# Patient Record
Sex: Female | Born: 1965 | Hispanic: No | Marital: Married | Smoking: Former smoker
Health system: Southern US, Community
[De-identification: ages and names within clinical notes are randomized; demographics above are authoritative.]

## PROBLEM LIST (undated history)

## (undated) HISTORY — PX: TUBAL LIGATION: SHX77

---

## 2001-02-26 ENCOUNTER — Other Ambulatory Visit: Admission: RE | Admit: 2001-02-26 | Discharge: 2001-02-26 | Payer: Self-pay | Admitting: Obstetrics and Gynecology

## 2013-07-08 ENCOUNTER — Other Ambulatory Visit: Payer: Self-pay | Admitting: Family Medicine

## 2013-07-08 DIAGNOSIS — Z1231 Encounter for screening mammogram for malignant neoplasm of breast: Secondary | ICD-10-CM

## 2013-07-15 ENCOUNTER — Ambulatory Visit: Payer: Self-pay

## 2013-07-16 ENCOUNTER — Ambulatory Visit
Admission: RE | Admit: 2013-07-16 | Discharge: 2013-07-16 | Disposition: A | Discharge status: Home / Self Care | Payer: 59 | Source: Ambulatory Visit | Attending: Family Medicine | Admitting: Family Medicine

## 2013-07-16 DIAGNOSIS — Z1231 Encounter for screening mammogram for malignant neoplasm of breast: Secondary | ICD-10-CM

## 2018-12-20 ENCOUNTER — Other Ambulatory Visit: Payer: Self-pay

## 2018-12-20 ENCOUNTER — Encounter: Payer: Self-pay | Admitting: Family Medicine

## 2018-12-20 ENCOUNTER — Ambulatory Visit (INDEPENDENT_AMBULATORY_CARE_PROVIDER_SITE_OTHER): Payer: BC Managed Care – PPO | Admitting: Family Medicine

## 2018-12-20 ENCOUNTER — Encounter (INDEPENDENT_AMBULATORY_CARE_PROVIDER_SITE_OTHER): Payer: Self-pay

## 2018-12-20 VITALS — BP 122/98 | HR 60 | Temp 98.4°F | Resp 16 | Ht 63.25 in | Wt 258.0 lb

## 2018-12-20 DIAGNOSIS — Z6841 Body Mass Index (BMI) 40.0 and over, adult: Secondary | ICD-10-CM | POA: Diagnosis not present

## 2018-12-20 DIAGNOSIS — R030 Elevated blood-pressure reading, without diagnosis of hypertension: Secondary | ICD-10-CM | POA: Diagnosis not present

## 2018-12-20 DIAGNOSIS — Z1239 Encounter for other screening for malignant neoplasm of breast: Secondary | ICD-10-CM

## 2018-12-20 DIAGNOSIS — N951 Menopausal and female climacteric states: Secondary | ICD-10-CM

## 2018-12-20 DIAGNOSIS — Z1211 Encounter for screening for malignant neoplasm of colon: Secondary | ICD-10-CM

## 2018-12-20 LAB — CBC
HCT: 38.2 % (ref 36.0–46.0)
Hemoglobin: 13.1 g/dL (ref 12.0–15.0)
MCHC: 34.2 g/dL (ref 30.0–36.0)
MCV: 90 fl (ref 78.0–100.0)
Platelets: 244 10*3/uL (ref 150.0–400.0)
RBC: 4.25 Mil/uL (ref 3.87–5.11)
RDW: 12.5 % (ref 11.5–15.5)
WBC: 7.9 10*3/uL (ref 4.0–10.5)

## 2018-12-20 LAB — COMPREHENSIVE METABOLIC PANEL
ALT: 30 U/L (ref 0–35)
AST: 22 U/L (ref 0–37)
Albumin: 4 g/dL (ref 3.5–5.2)
Alkaline Phosphatase: 89 U/L (ref 39–117)
BUN: 16 mg/dL (ref 6–23)
CO2: 30 mEq/L (ref 19–32)
Calcium: 9.4 mg/dL (ref 8.4–10.5)
Chloride: 103 mEq/L (ref 96–112)
Creatinine, Ser: 0.79 mg/dL (ref 0.40–1.20)
GFR: 76.42 mL/min (ref >60.00)
Glucose, Bld: 79 mg/dL (ref 70–99)
Potassium: 4 mEq/L (ref 3.5–5.1)
Sodium: 139 mEq/L (ref 135–145)
Total Bilirubin: 0.8 mg/dL (ref 0.2–1.2)
Total Protein: 7.6 g/dL (ref 6.0–8.3)

## 2018-12-20 LAB — LIPID PANEL
Cholesterol: 207 mg/dL — ABNORMAL HIGH (ref 0–200)
HDL: 65.4 mg/dL (ref >39.00)
LDL Cholesterol: 127 mg/dL — ABNORMAL HIGH (ref 0–99)
NonHDL: 142.08
Total CHOL/HDL Ratio: 3
Triglycerides: 73 mg/dL (ref 0.0–149.0)
VLDL: 14.6 mg/dL (ref 0.0–40.0)

## 2018-12-20 LAB — TSH: TSH: 1.36 u[IU]/mL (ref 0.35–4.50)

## 2018-12-20 NOTE — Assessment & Plan Note (Signed)
Will get labs today. Pt also working on walking routine. She will be calorie counting and try to eat smaller meals throughout the day.

## 2018-12-20 NOTE — Assessment & Plan Note (Addendum)
Discussion on options. Has already been doing some lifestyle options. Worse at night. Will continue trial of Black Cohosh. If no improvement in 1-2 months will let me know and we can consider other prescription medication. Encouraged sleep aid as it seems to be helping. If significant side effects on OTC could do ambien in the future.

## 2018-12-20 NOTE — Assessment & Plan Note (Signed)
Reports normal BP at home with occasional monitoring. Will continue to monitor.

## 2018-12-20 NOTE — Progress Notes (Signed)
Subjective:     Renee Daniels is a [age] y.o. female presenting for Establish Care ([place] and Discuss menopause. (has had irregular period x 6 months, hot flashes, night sweats-since February 2020. )     HPI   #Menopausal Symptoms - irregular period x 6 months - hot flashes and night sweats since Feb 2020 - will get a cycle every 2 months and have been heavy - heaviest day changing her pad every 1-2 hours on day 2 and 3 - hot flashes have been "unbearable" and night sweats are impacting sleep - is waking up in the middle of the night with symptoms - every 45 minutes will start - using a fan by her bedside and portable fan in her face, and ceiling - is occasionally getting them during the day - no vaginal symptoms - has been walking 3.5 miles at 7 am - just started black Cohosh last week - taking sleep aid which does help but causes some terror dreams - melatonin does not work as well   #weight gain - feels like she gained 20 lbs in the last 4 months - typically eating 1 big meal a day - and not doing a lot of small meals - does not take time to eat  - Takes her BP at home and has no history of being high in the past  Review of Systems  Constitutional: Negative for chills and fever.  Respiratory: Negative for cough and shortness of breath.   Cardiovascular: Negative for chest pain.  Endocrine: Positive for heat intolerance. Negative for polydipsia and polyuria.  Genitourinary: Negative for vaginal pain.  Neurological: Negative for dizziness.     Social History   Tobacco Use  Smoking Status Former Smoker  . Packs/day: 0.25  . Years: 17.00  . Pack years: 4.25  . Types: Cigarettes  . Last attempt to quit: 2011  . Years since quitting: 9.4  Smokeless Tobacco Never Used        Objective:    BP Readings from Last 3 Encounters:  12/20/18 (!) 122/98   Wt Readings from Last 3 Encounters:  12/20/18 258 lb (117 kg)   Previous weight had been as low as 230 lbs  BP (!) 122/98   Pulse 60   Temp 98.4 F (36.9 C)   Resp 16   Ht 5' 3.25" (1.607 m)   Wt 258 lb (117 kg)   LMP 11/18/2018   SpO2 96%   BMI 45.34 kg/m    Physical Exam Constitutional:      General: She is not in acute distress.    Appearance: She is well-developed. She is not diaphoretic.  HENT:     Right Ear: External ear normal.     Left Ear: External ear normal.     Nose: Nose normal.  Eyes:     Conjunctiva/sclera: Conjunctivae normal.  Neck:     Musculoskeletal: Neck supple.  Cardiovascular:     Rate and Rhythm: Normal rate and regular rhythm.     Heart sounds: No murmur.  Pulmonary:     Effort: Pulmonary effort is normal. No respiratory distress.     Breath sounds: Normal breath sounds. No wheezing.  Skin:    General: Skin is warm and dry.     Capillary Refill: Capillary refill takes less than 2 seconds.  Neurological:     Mental Status: She is alert. Mental status is at baseline.  Psychiatric:  Mood and Affect: Mood normal.        Behavior: Behavior normal.           Assessment & Plan:   Problem List Items Addressed This Visit      Other   Perimenopause - Primary    Discussion on options. Has already been doing some lifestyle options. Worse at night. Will continue trial of Black Cohosh. If no improvement in 1-2 months will let me know and we can consider other prescription medication. Encouraged sleep aid as it seems to be helping. If significant side effects on OTC could do ambien in the future.      Relevant Orders   CBC   Class 3 severe obesity due to excess calories without serious comorbidity with body mass index (BMI) of 45.0 to 49.9 in adult [facility])    Will get labs today. Pt also working on walking routine. She will be calorie counting and try to eat smaller meals throughout the day.       Relevant Orders   Lipid panel   Comprehensive metabolic panel   TSH   Elevated blood pressure reading     Reports normal BP at home with occasional monitoring. Will continue to monitor.        Other Visit Diagnoses    Screening for colon cancer       Relevant Orders   Fecal occult blood, imunochemical   Screening for breast cancer       Relevant Orders   MM Digital Screening       Return in about 4 weeks (around 01/17/2019) for if menopausal symptoms not improving.  Lynnda Child, MD

## 2018-12-20 NOTE — Patient Instructions (Signed)
Menopause Menopause may increase your risk for:  Loss of bone (osteoporosis), which causes bone breaks (fractures).  Depression.  Hardening and narrowing of the arteries (atherosclerosis), which can cause heart attacks and strokes. What are the causes? This condition is usually caused by a natural change in hormone levels that happens as you get older. The condition may also be caused by surgery to remove both ovaries (bilateral oophorectomy). Follow these instructions at home: Lifestyle  Do not use any products that contain nicotine or tobacco, such as cigarettes and e-cigarettes. If you need help quitting, ask your health care provider.  Get at least 30 minutes of physical activity on 5 or more days each week.  Avoid alcoholic and caffeinated beverages, as well as spicy foods. This may help prevent hot flashes.  Get 7-8 hours of sleep each night.  If you have hot flashes, try: ? Dressing in layers. ? Avoiding things that may trigger hot flashes, such as spicy food, hot drinks, alcohol, caffeine, warm places, or stress. ? Taking slow, deep breaths when a hot flash starts. ? Keeping a fan in your home and office.  Find ways to manage stress: regular exercise, meditation, yoga, qigong, Tai Chi, biofeedback, acupuncture or massage  Consider going to group therapy with other women who are having menopause symptoms. Ask your health care provider about recommended group therapy meetings.  Staying cool while sleeping: dress in light clothing, use layed bedding that can be easily removed, Sleep with a fan nearby, put an ice pack under your pillow and flip your pillow regularly  Eating and drinking  Eat a healthy, balanced diet that contains whole grains, lean protein, low-fat dairy, and plenty of fruits and vegetables.  Your health care provider may recommend adding more soy to your diet. Foods that contain soy include tofu, tempeh, and soy milk.  Eat plenty of foods that contain  calcium and vitamin D for bone health. Items that are rich in calcium include low-fat milk, yogurt, beans, almonds, sardines, broccoli, and kale. Medicines  Non-prescription medications for Hot Flashes ? Soy - eat 1-2 servings of soy foods daily ? Herbs: like black cohosh have shown some improvement with hot flashes  Talk with your health care provider before starting any herbal supplements. If prescribed, take vitamins and supplements as told by your health care provider. These may include: ? Calcium. Women age 51 and older should get 1,200 mg (milligrams) of calcium every day. ? Vitamin D. Women need 600-800 International Units of vitamin D each day.  Prescription Medications ? Hormone Treatment: increased risk of breast cancer and cardiovascular disease. Should be used for a short time and in women under 60 have a lower risk overall if they take it ? Non-Hormone Treatment: Paroxetine which is an antidepressant, has been shown to reduce Hot Flashes   

## 2018-12-31 ENCOUNTER — Other Ambulatory Visit (INDEPENDENT_AMBULATORY_CARE_PROVIDER_SITE_OTHER): Payer: BC Managed Care – PPO

## 2018-12-31 DIAGNOSIS — Z1211 Encounter for screening for malignant neoplasm of colon: Secondary | ICD-10-CM

## 2018-12-31 LAB — FECAL OCCULT BLOOD, IMMUNOCHEMICAL: Fecal Occult Bld: NEGATIVE

## 2019-01-07 ENCOUNTER — Telehealth: Payer: Self-pay

## 2019-01-07 NOTE — Telephone Encounter (Signed)
Left message for patient to call back  

## 2019-01-10 ENCOUNTER — Other Ambulatory Visit: Payer: Self-pay

## 2019-01-10 ENCOUNTER — Ambulatory Visit
Admission: RE | Admit: 2019-01-10 | Discharge: 2019-01-10 | Disposition: A | Discharge status: Home / Self Care | Payer: BC Managed Care – PPO | Source: Ambulatory Visit | Attending: Family Medicine | Admitting: Family Medicine

## 2019-01-10 DIAGNOSIS — Z1239 Encounter for other screening for malignant neoplasm of breast: Secondary | ICD-10-CM

## 2019-01-11 ENCOUNTER — Telehealth: Payer: Self-pay

## 2019-01-11 NOTE — Telephone Encounter (Signed)
Left message for patient to call back  

## 2019-01-14 NOTE — Telephone Encounter (Signed)
[name] - Client Nonclinical Telephone Record AccessNurse Client Renee Daniels - Client Client Site [place] Physician Waunita Schooner- MD Contact Type Call Who Is Calling Patient / Member / Family / Caregiver Caller Name Renee Daniels Caller Phone Number [phone] Call Type Message Only Information Provided Reason for Call Returning a Call from the Office Initial [place] states that she was just returning a call from [place]. Additional Comment Call Closed By: Carma Lair Transaction Date/Time: 01/11/2019 5:09:20 PM (ET)

## 2019-01-14 NOTE — Telephone Encounter (Signed)
Patient advised of mammogram results.  °

## 2020-02-25 ENCOUNTER — Other Ambulatory Visit: Payer: Self-pay | Admitting: Family Medicine

## 2020-02-25 DIAGNOSIS — Z1231 Encounter for screening mammogram for malignant neoplasm of breast: Secondary | ICD-10-CM

## 2020-02-28 ENCOUNTER — Ambulatory Visit
Admission: RE | Admit: 2020-02-28 | Discharge: 2020-02-28 | Disposition: A | Discharge status: Home / Self Care | Payer: BC Managed Care – PPO | Source: Ambulatory Visit | Attending: Family Medicine | Admitting: Family Medicine

## 2020-02-28 ENCOUNTER — Other Ambulatory Visit: Payer: Self-pay

## 2020-02-28 DIAGNOSIS — Z1231 Encounter for screening mammogram for malignant neoplasm of breast: Secondary | ICD-10-CM

## 2020-04-23 ENCOUNTER — Other Ambulatory Visit (HOSPITAL_COMMUNITY)
Admission: RE | Admit: 2020-04-23 | Discharge: 2020-04-23 | Disposition: A | Discharge status: Home / Self Care | Payer: BC Managed Care – PPO | Source: Ambulatory Visit | Attending: Family Medicine | Admitting: Family Medicine

## 2020-04-23 ENCOUNTER — Other Ambulatory Visit: Payer: Self-pay

## 2020-04-23 ENCOUNTER — Ambulatory Visit (INDEPENDENT_AMBULATORY_CARE_PROVIDER_SITE_OTHER): Payer: BC Managed Care – PPO | Admitting: Family Medicine

## 2020-04-23 VITALS — BP 126/90 | HR 59 | Temp 95.2°F | Ht 63.0 in | Wt 266.0 lb

## 2020-04-23 DIAGNOSIS — Z0000 Encounter for general adult medical examination without abnormal findings: Secondary | ICD-10-CM | POA: Diagnosis not present

## 2020-04-23 DIAGNOSIS — Z6841 Body Mass Index (BMI) 40.0 and over, adult: Secondary | ICD-10-CM

## 2020-04-23 DIAGNOSIS — Z124 Encounter for screening for malignant neoplasm of cervix: Secondary | ICD-10-CM | POA: Insufficient documentation

## 2020-04-23 DIAGNOSIS — I10 Essential (primary) hypertension: Secondary | ICD-10-CM | POA: Diagnosis not present

## 2020-04-23 DIAGNOSIS — Z1211 Encounter for screening for malignant neoplasm of colon: Secondary | ICD-10-CM

## 2020-04-23 DIAGNOSIS — Z23 Encounter for immunization: Secondary | ICD-10-CM | POA: Diagnosis not present

## 2020-04-23 LAB — LIPID PANEL
Cholesterol: 217 mg/dL — ABNORMAL HIGH (ref 0–200)
HDL: 58.2 mg/dL (ref >39.00)
LDL Cholesterol: 130 mg/dL — ABNORMAL HIGH (ref 0–99)
NonHDL: 158.56
Total CHOL/HDL Ratio: 4
Triglycerides: 144 mg/dL (ref 0.0–149.0)
VLDL: 28.8 mg/dL (ref 0.0–40.0)

## 2020-04-23 LAB — COMPREHENSIVE METABOLIC PANEL
ALT: 49 U/L — ABNORMAL HIGH (ref 0–35)
AST: 54 U/L — ABNORMAL HIGH (ref 0–37)
Albumin: 4.1 g/dL (ref 3.5–5.2)
Alkaline Phosphatase: 130 U/L — ABNORMAL HIGH (ref 39–117)
BUN: 24 mg/dL — ABNORMAL HIGH (ref 6–23)
CO2: 30 mEq/L (ref 19–32)
Calcium: 9.7 mg/dL (ref 8.4–10.5)
Chloride: 103 mEq/L (ref 96–112)
Creatinine, Ser: 0.9 mg/dL (ref 0.40–1.20)
GFR: 72.82 mL/min (ref >60.00)
Glucose, Bld: 85 mg/dL (ref 70–99)
Potassium: 4.4 mEq/L (ref 3.5–5.1)
Sodium: 140 mEq/L (ref 135–145)
Total Bilirubin: 0.5 mg/dL (ref 0.2–1.2)
Total Protein: 7.5 g/dL (ref 6.0–8.3)

## 2020-04-23 LAB — HEMOGLOBIN A1C: Hgb A1c MFr Bld: 4.6 % (ref 4.6–6.5)

## 2020-04-23 NOTE — Progress Notes (Signed)
Annual Exam   Chief Complaint:  Chief Complaint  Patient presents with  . Annual Exam    w/ pap     History of Present Illness:  Ms. Renee Daniels is a 54 y.o. No obstetric history on file. who LMP was No LMP recorded. Patient is perimenopausal., presents today for her annual examination.     Nutrition She does get adequate calcium and Vitamin D in her diet. Diet: intermittent fasting - 2 weeks, light meals, more protein and decreased carbs Exercise: walking more, but not as consistent as she should  Safety The patient wears seatbelts: yes.     The patient feels safe at home and in their relationships: yes.  Menstrual:  Symptoms of menopause: hot flashes - using a fan    GYN She is single partner, contraception - post menopausal status.    Cervical Cancer Screening (21-65):   Last Pap:  2018 Results were: no abnormalities /neg HPV DNA - unknown  Breast Cancer Screening (Age 50-74):  There is no FH of breast cancer. There is no FH of ovarian cancer. BRCA screening Not Indicated.  Last Mammogram: 02/2020 The patient does want a mammogram this year.    Colon Cancer Screening:  Age 23-75 yo - benefits outweigh the risk. Adults 31-85 yo who have never been screened benefit.  Benefits: 134000 people in 2016 will be diagnosed and 49,000 will die - early detection helps [place]: Complications 2/2 to colonoscopy High Risk (Colonoscopy): genetic disorder (Lynch syndrome or familial adenomatous polyposis), personal hx of IBD, previous adenomatous polyp, or previous colorectal cancer, FamHx start 10 years before the age at diagnosis, increased in males and black race  Options:  FIT - looks for hemoglobin (blood in the stool) - specific and fairly sensitive - must be done annually Cologuard - looks for DNA and blood - more sensitive - therefore can have more false positives, every 3 years Colonoscopy - every 10 years if normal - sedation, bowl prep, must have someone drive  you  Shared decision making and the patient had decided to do colonoscopy.   Social History   Tobacco Use  Smoking Status Former Smoker  . Packs/day: 0.25  . Years: 17.00  . Pack years: 4.25  . Types: Cigarettes  . Quit date: 2011  . Years since quitting: 10.7  Smokeless Tobacco Never Used    Lung Cancer Screening (Ages 32-80): no   Weight Wt Readings from Last 3 Encounters:  04/23/20 266 lb (120.7 kg)  12/20/18 258 lb (117 kg)   Patient has very high BMI  BMI Readings from Last 1 Encounters:  04/23/20 47.12 kg/m     Chronic disease screening Blood pressure monitoring:  BP Readings from Last 3 Encounters:  04/23/20 126/90  12/20/18 (!) 122/98    Lipid Monitoring: Indication for screening: age >47, obesity, diabetes, family hx, CV risk factors.  Lipid screening: Yes  Lab Results  Component Value Date   CHOL 207 (H) 12/20/2018   HDL 65.40 12/20/2018   LDLCALC 127 (H) 12/20/2018   TRIG 73.0 12/20/2018   CHOLHDL 3 12/20/2018     Diabetes Screening: age >44, overweight, family hx, PCOS, hx of gestational diabetes, at risk ethnicity Diabetes Screening screening: Yes  No results found for: HGBA1C   No past medical history on file.  Past Surgical History:  Procedure Laterality Date  . CESAREAN SECTION     x 3  . TUBAL LIGATION      Prior to Admission medications  Medication Sig Start Date End Date Taking? Authorizing Provider  Multiple Vitamin (MULTIVITAMIN) tablet Take 1 tablet by mouth daily.   Yes [provider]  Nutritional Supplements (VITAMIN D BOOSTER PO) Take by mouth. 5000 units daily   Yes [provider]    No Known Allergies  Gynecologic History: No LMP recorded. Patient is perimenopausal.  Obstetric History: No obstetric history on file.  Social History   Socioeconomic History  . Marital status: Married    Spouse name: Hilliard Clark  . Number of children: 3  . Years of education: College  . Highest education level:  Not on file  Occupational History  . Not on file  Tobacco Use  . Smoking status: Former Smoker    Packs/day: 0.25    Years: 17.00    Pack years: 4.25    Types: Cigarettes    Quit date: 2011    Years since quitting: 10.7  . Smokeless tobacco: Never Used  Vaping Use  . Vaping Use: Never used  Substance and Sexual Activity  . Alcohol use: Yes    Comment: twice a week, 1 mixed drink  . Drug use: Never  . Sexual activity: Yes    Birth control/protection: Surgical  Other Topics Concern  . 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 Difficulty of Paying Living Expenses: Not on file  Food Insecurity:   . Worried About Charity fundraiser in the Last Year: Not on file  . Ran Out of Food in the Last Year: Not on file  Transportation Needs:   . Lack of Transportation (Medical): Not on file  . Lack of Transportation (Non-Medical): Not on file  Physical Activity:   . Days of Exercise per Week: Not on file  . Minutes of Exercise per Session: Not on file  Stress:   . Feeling of Stress : Not on file  Social Connections:   . Frequency of Communication with Friends and Family: Not on file  . Frequency of Social Gatherings with Friends and Family: Not on file  . Attends Religious Services: Not on file  . Active Member of Clubs or Organizations: Not on file  . Attends Archivist Meetings: Not on file  . Marital Status: Not on file  Intimate Partner Violence:   . Fear of Current or  Ex-Partner: Not on file  . Emotionally Abused: Not on file  . Physically Abused: Not on file  . Sexually Abused: Not on file    Family History  Problem Relation Age of Onset  . Cancer Mother        leukemia or lymphoma  . Hypertension Father   . Healthy Brother   . Healthy Daughter   . Healthy Son   . Pancreatic cancer Maternal Grandmother   . Heart disease Maternal Grandfather   .  Heart disease Paternal Grandmother   . Healthy Daughter   . Lung cancer Maternal Aunt     Review of Systems  Constitutional: Negative for chills and fever.  HENT: Negative for congestion and sore throat.   Eyes: Negative for blurred vision and double vision.  Respiratory: Negative for shortness of breath.   Cardiovascular: Negative for chest pain.  Gastrointestinal: Negative for heartburn, nausea and vomiting.  Genitourinary: Negative.   Musculoskeletal: Negative.  Negative for myalgias.  Skin: Negative for rash.  Neurological: Negative for dizziness and headaches.  Endo/Heme/Allergies: Does not bruise/bleed easily.  Psychiatric/Behavioral: Negative for depression. The patient is not nervous/anxious.      Physical Exam BP 126/90   Pulse (!) 59   Temp (!) 95.2 F (35.1 C) (Temporal)   Ht '5\' 3"'  (1.6 m)   Wt 266 lb (120.7 kg)   SpO2 97%   BMI 47.12 kg/m    BP Readings from Last 3 Encounters:  04/23/20 126/90  12/20/18 (!) 122/98      Physical Exam Exam conducted with a chaperone present.  Constitutional:      General: She is not in acute distress.    Appearance: She is well-developed. She is not diaphoretic.  HENT:     Head: Normocephalic and atraumatic.     Right Ear: External ear normal.     Left Ear: External ear normal.     Nose: Nose normal.  Eyes:     General: No scleral icterus.    Conjunctiva/sclera: Conjunctivae normal.  Cardiovascular:     Rate and Rhythm: Normal rate and regular rhythm.     Heart sounds: No murmur heard.   Pulmonary:     Effort: Pulmonary  effort is normal. No respiratory distress.     Breath sounds: Normal breath sounds. No wheezing.  Abdominal:     General: Bowel sounds are normal. There is no distension.     Palpations: Abdomen is soft. There is no mass.     Tenderness: There is no abdominal tenderness. There is no guarding or rebound.  Genitourinary:    Vagina: Normal.     Cervix: Normal.  Musculoskeletal:        General: Normal range of motion.     Cervical back: Neck supple.  Lymphadenopathy:     Cervical: No cervical adenopathy.  Skin:    General: Skin is warm and dry.     Capillary Refill: Capillary refill takes less than 2 seconds.  Neurological:     Mental Status: She is alert and oriented to person, place, and time.     Deep Tendon Reflexes: Reflexes normal.  Psychiatric:        Behavior: Behavior normal.     Results:  PHQ-9:    Office Visit from 12/20/2018 in [place] at [facility]  PHQ-9 Total Score 3        Assessment: 54 y.o. No obstetric history on file. female here for routine annual physical examination.  Plan: Problem List Items Addressed This Visit      Other   Class 3 severe obesity due to excess calories without serious comorbidity with body mass index (BMI) of 45.0 to 49.9 in adult [facility])   Relevant Orders   Lipid panel   Hemoglobin A1c    Other Visit Diagnoses    Annual physical exam    -  Primary   Need for influenza vaccination       Relevant Orders   Flu Vaccine QUAD 36+ mos IM (Completed)  Screening for colon cancer       Relevant Orders   Ambulatory referral to Gastroenterology   Essential hypertension       Relevant Orders   Comprehensive metabolic panel   Cervical cancer screening       Relevant Orders   Cytology - PAP      Screening: -- Blood pressure screen elevated: continued to monitor. -- cholesterol screening: will obtain -- Weight screening: overweight: continue to monitor -- Diabetes Screening: will obtain -- Nutrition: Encouraged  healthy diet  The 10-year ASCVD risk score Mikey Bussing DC Jr., et al., 2013) is: 1.4%   Values used to calculate the score:     Age: 31 years     Sex: Female     Is Non-Hispanic African American: No     Diabetic: No     Tobacco smoker: No     Systolic Blood Pressure: 578 mmHg     Is BP treated: No     HDL Cholesterol: 65.4 mg/dL     Total Cholesterol: 207 mg/dL  -- Statin therapy for Age 50-75 with CVD risk >7.5%  Psych -- Depression screening (PHQ-9):    Office Visit from 12/20/2018 in [place] at [facility]  PHQ-9 Total Score 3       Safety -- tobacco screening: not using -- alcohol screening:  low-risk usage. -- no evidence of domestic violence or intimate partner violence.   Cancer Screening -- pap smear collected per ASCCP guidelines -- family history of breast cancer screening: done. not at high risk. -- Mammogram - up to date -- Colon cancer (age 63+)-- referral  Immunizations Immunization History  Administered Date(s) Administered  . Hepatitis A 01/02/2016, 09/25/2016  . Hepatitis B 10/15/2016, 12/17/2016, 09/07/2017  . Influenza,inj,Quad PF,6+ Mos 04/23/2020  . PFIZER SARS-COV-2 Vaccination 10/12/2019, 11/05/2019  . Tdap 05/15/2017    -- flu vaccine up to date -- TDAP q10 years up to date -- Covid-19 Vaccine up to date   Encouraged healthy diet and exercise. Encouraged regular vision and dental care.    Lesleigh Noe, MD

## 2020-04-23 NOTE — Patient Instructions (Addendum)
Hot flashes - Try Vitamin E supplement for 2-4 weeks - If not working we can try Lexapro   Preventive Care 65-53 Years Old, Female Preventive care refers to visits with your health care provider and lifestyle choices that can promote health and wellness. This includes:  A yearly physical exam. This may also be called an annual well check.  Regular dental visits and eye exams.  Immunizations.  Screening for certain conditions.  Healthy lifestyle choices, such as eating a healthy diet, getting regular exercise, not using drugs or products that contain nicotine and tobacco, and limiting alcohol use. What can I expect for my preventive care visit? Physical exam Your health care provider will check your:  Height and weight. This may be used to calculate body mass index (BMI), which tells if you are at a healthy weight.  Heart rate and blood pressure.  Skin for abnormal spots. Counseling Your health care provider may ask you questions about your:  Alcohol, tobacco, and drug use.  Emotional well-being.  Home and relationship well-being.  Sexual activity.  Eating habits.  Work and work Statistician.  Method of birth control.  Menstrual cycle.  Pregnancy history. What immunizations do I need?  Influenza (flu) vaccine  This is recommended every year. Tetanus, diphtheria, and pertussis (Tdap) vaccine  You may need a Td booster every 10 years. Varicella (chickenpox) vaccine  You may need this if you have not been vaccinated. Zoster (shingles) vaccine  You may need this after age 9. Measles, mumps, and rubella (MMR) vaccine  You may need at least one dose of MMR if you were born in 1957 or later. You may also need a second dose. Pneumococcal conjugate (PCV13) vaccine  You may need this if you have certain conditions and were not previously vaccinated. Pneumococcal polysaccharide (PPSV23) vaccine  You may need one or two doses if you smoke cigarettes or if you  have certain conditions. Meningococcal conjugate (MenACWY) vaccine  You may need this if you have certain conditions. Hepatitis A vaccine  You may need this if you have certain conditions or if you travel or work in places where you may be exposed to hepatitis A. Hepatitis B vaccine  You may need this if you have certain conditions or if you travel or work in places where you may be exposed to hepatitis B. Haemophilus influenzae type b (Hib) vaccine  You may need this if you have certain conditions. Human papillomavirus (HPV) vaccine  If recommended by your health care provider, you may need three doses over 6 months. You may receive vaccines as individual doses or as more than one vaccine together in one shot (combination vaccines). Talk with your health care provider about the risks and benefits of combination vaccines. What tests do I need? Blood tests  Lipid and cholesterol levels. These may be checked every 5 years, or more frequently if you are over 38 years old.  Hepatitis C test.  Hepatitis B test. Screening  Lung cancer screening. You may have this screening every year starting at age 55 if you have a 30-pack-year history of smoking and currently smoke or have quit within the past 15 years.  Colorectal cancer screening. All adults should have this screening starting at age 20 and continuing until age 37. Your health care provider may recommend screening at age 22 if you are at increased risk. You will have tests every 1-10 years, depending on your results and the type of screening test.  Diabetes screening. This is  done by checking your blood sugar (glucose) after you have not eaten for a while (fasting). You may have this done every 1-3 years.  Mammogram. This may be done every 1-2 years. Talk with your health care provider about when you should start having regular mammograms. This may depend on whether you have a family history of breast cancer.  BRCA-related cancer  screening. This may be done if you have a family history of breast, ovarian, tubal, or peritoneal cancers.  Pelvic exam and Pap test. This may be done every 3 years starting at age 2. Starting at age 82, this may be done every 5 years if you have a Pap test in combination with an HPV test. Other tests  Sexually transmitted disease (STD) testing.  Bone density scan. This is done to screen for osteoporosis. You may have this scan if you are at high risk for osteoporosis. Follow these instructions at home: Eating and drinking  Eat a diet that includes fresh fruits and vegetables, whole grains, lean protein, and low-fat dairy.  Take vitamin and mineral supplements as recommended by your health care provider.  Do not drink alcohol if: ? Your health care provider tells you not to drink. ? You are pregnant, may be pregnant, or are planning to become pregnant.  If you drink alcohol: ? Limit how much you have to 0-1 drink a day. ? Be aware of how much alcohol is in your drink. In the [place] one drink equals one 12 oz bottle of beer (355 mL), one 5 oz glass of wine (148 mL), or one 1 oz glass of hard liquor (44 mL). Lifestyle  Take daily care of your teeth and gums.  Stay active. Exercise for at least 30 minutes on 5 or more days each week.  Do not use any products that contain nicotine or tobacco, such as cigarettes, e-cigarettes, and chewing tobacco. If you need help quitting, ask your health care provider.  If you are sexually active, practice safe sex. Use a condom or other form of birth control (contraception) in order to prevent pregnancy and STIs (sexually transmitted infections).  If told by your health care provider, take low-dose aspirin daily starting at age 34. What's next?  Visit your health care provider once a year for a well check visit.  Ask your health care provider how often you should have your eyes and teeth checked.  Stay up to date on all vaccines. This  information is not intended to replace advice given to you by your health care provider. Make sure you discuss any questions you have with your health care provider. Document Revised: 03/15/2018 Document Reviewed: 03/15/2018 Elsevier Patient Education  2020 [employer].

## 2020-04-30 LAB — CYTOLOGY - PAP
Comment: NEGATIVE
Diagnosis: UNDETERMINED — AB
High risk HPV: NEGATIVE

## 2020-05-20 ENCOUNTER — Telehealth (INDEPENDENT_AMBULATORY_CARE_PROVIDER_SITE_OTHER): Payer: Self-pay | Admitting: Gastroenterology

## 2020-05-20 ENCOUNTER — Other Ambulatory Visit: Payer: Self-pay

## 2020-05-20 DIAGNOSIS — Z1211 Encounter for screening for malignant neoplasm of colon: Secondary | ICD-10-CM

## 2020-05-20 MED ORDER — PEG 3350-KCL-NA BICARB-NACL 420 G PO SOLR: 4000.0000 mL | Freq: Once | ORAL | 0 refills | Status: AC

## 2020-05-20 NOTE — Progress Notes (Signed)
Gastroenterology Pre-Procedure Review  Request Date: 05/25/20 Requesting Physician: Dr. Tobi Bastos  PATIENT REVIEW QUESTIONS: The patient responded to the following health history questions as indicated:    1. Are you having any GI issues? no 2. Do you have a personal history of Polyps? no 3. Do you have a family history of Colon Cancer or Polyps? no 4. Diabetes Mellitus? no 5. Joint replacements in the past 12 months?no 6. Major health problems in the past 3 months?no 7. Any artificial heart valves, MVP, or defibrillator?no    MEDICATIONS & ALLERGIES:    Patient reports the following regarding taking any anticoagulation/antiplatelet therapy:   Plavix, Coumadin, Eliquis, Xarelto, Lovenox, Pradaxa, Brilinta, or Effient? no Aspirin? no  Patient confirms/reports the following medications:  Current Outpatient Medications  Medication Sig Dispense Refill   Multiple Vitamin (MULTIVITAMIN) tablet Take 1 tablet by mouth daily.     Nutritional Supplements (VITAMIN D BOOSTER PO) Take by mouth. 5000 units daily     polyethylene glycol-electrolytes (NULYTELY) 420 g solution Take 4,000 mLs by mouth once for 1 dose. 4000 mL 0   No current facility-administered medications for this visit.    Patient confirms/reports the following allergies:  No Known Allergies  Orders Placed This Encounter  Procedures   Procedural/ Surgical Case Request: COLONOSCOPY WITH PROPOFOL    Standing Status:   Standing    Number of Occurrences:   1    Order Specific Question:   Pre-op diagnosis    Answer:   screening colonoscopy    Order Specific Question:   CPT Code    Answer:   86761    AUTHORIZATION INFORMATION Primary Insurance: 1D#: Group #:  Secondary Insurance: 1D#: Group #:  SCHEDULE INFORMATION: Date: 05/25/20 Time: Location:[facility]

## 2020-05-21 ENCOUNTER — Other Ambulatory Visit: Payer: Self-pay

## 2020-05-21 ENCOUNTER — Other Ambulatory Visit
Admission: RE | Admit: 2020-05-21 | Discharge: 2020-05-21 | Disposition: A | Discharge status: Home / Self Care | Payer: BC Managed Care – PPO | Source: Ambulatory Visit | Attending: Gastroenterology | Admitting: Gastroenterology

## 2020-05-21 DIAGNOSIS — Z20822 Contact with and (suspected) exposure to covid-19: Secondary | ICD-10-CM | POA: Insufficient documentation

## 2020-05-21 DIAGNOSIS — Z01818 Encounter for other preprocedural examination: Secondary | ICD-10-CM | POA: Insufficient documentation

## 2020-05-21 LAB — SARS CORONAVIRUS 2 (TAT 6-24 HRS): SARS Coronavirus 2: NEGATIVE

## 2020-05-25 ENCOUNTER — Encounter: Payer: Self-pay | Admitting: Gastroenterology

## 2020-05-25 ENCOUNTER — Ambulatory Visit
Admission: RE | Admit: 2020-05-25 | Discharge: 2020-05-25 | Disposition: A | Discharge status: Home / Self Care | Payer: BC Managed Care – PPO | Attending: Gastroenterology | Admitting: Gastroenterology

## 2020-05-25 ENCOUNTER — Encounter
Admission: RE | Disposition: A | Discharge status: Home / Self Care | Payer: Self-pay | Source: Home / Self Care | Attending: Gastroenterology

## 2020-05-25 ENCOUNTER — Ambulatory Visit: Payer: BC Managed Care – PPO | Admitting: Certified Registered"

## 2020-05-25 ENCOUNTER — Other Ambulatory Visit: Payer: Self-pay

## 2020-05-25 DIAGNOSIS — Z87891 Personal history of nicotine dependence: Secondary | ICD-10-CM | POA: Diagnosis not present

## 2020-05-25 DIAGNOSIS — Z1211 Encounter for screening for malignant neoplasm of colon: Secondary | ICD-10-CM | POA: Insufficient documentation

## 2020-05-25 DIAGNOSIS — Z6841 Body Mass Index (BMI) 40.0 and over, adult: Secondary | ICD-10-CM | POA: Diagnosis not present

## 2020-05-25 HISTORY — PX: COLONOSCOPY WITH PROPOFOL: SHX5780

## 2020-05-25 SURGERY — COLONOSCOPY WITH PROPOFOL
Anesthesia: General

## 2020-05-25 MED ORDER — PROPOFOL 10 MG/ML IV BOLUS
INTRAVENOUS | Status: DC | PRN
  Administered 2020-05-25 (×2): 50 mg via INTRAVENOUS

## 2020-05-25 MED ORDER — LIDOCAINE HCL (CARDIAC) PF 100 MG/5ML IV SOSY
PREFILLED_SYRINGE | INTRAVENOUS | Status: DC | PRN
  Administered 2020-05-25: 50 mg via INTRAVENOUS

## 2020-05-25 MED ORDER — SODIUM CHLORIDE 0.9 % IV SOLN: INTRAVENOUS | Status: DC

## 2020-05-25 MED ORDER — PROPOFOL 500 MG/50ML IV EMUL
INTRAVENOUS | Status: DC | PRN
  Administered 2020-05-25: 150 ug/kg/min via INTRAVENOUS

## 2020-05-25 NOTE — Anesthesia Preprocedure Evaluation (Signed)
Anesthesia Evaluation  Patient identified by MRN, date of birth, ID band Patient awake    Reviewed: Allergy & Precautions, H&P , NPO status , Patient's Chart, lab work & pertinent test results, reviewed documented beta blocker date and time   History of Anesthesia Complications Negative for: history of anesthetic complications  Airway Mallampati: II  TM Distance: >3 FB Neck ROM: full    Dental  (+) Dental Advidsory Given, Teeth Intact   Pulmonary neg pulmonary ROS, former smoker,    Pulmonary exam normal breath sounds clear to auscultation       Cardiovascular Exercise Tolerance: Good negative cardio ROS Normal cardiovascular exam Rhythm:regular Rate:Normal     Neuro/Psych negative neurological ROS  negative psych ROS   GI/Hepatic negative GI ROS, Neg liver ROS,   Endo/Other  neg diabetesMorbid obesity  Renal/GU negative Renal ROS  negative genitourinary   Musculoskeletal   Abdominal   Peds  Hematology negative hematology ROS (+)   Anesthesia Other Findings History reviewed. No pertinent past medical history.   Reproductive/Obstetrics negative OB ROS                             Anesthesia Physical Anesthesia Plan  ASA: III  Anesthesia Plan: General   Post-op Pain Management:    Induction: Intravenous  PONV Risk Score and Plan: 3 and Propofol infusion and TIVA  Airway Management Planned: Natural Airway and Nasal Cannula  Additional Equipment:   Intra-op Plan:   Post-operative Plan:   Informed Consent: I have reviewed the patients History and Physical, chart, labs and discussed the procedure including the risks, benefits and alternatives for the proposed anesthesia with the patient or authorized representative who has indicated his/her understanding and acceptance.     Dental Advisory Given  Plan Discussed with: Anesthesiologist, CRNA and Surgeon  Anesthesia Plan  Comments:         Anesthesia Quick Evaluation

## 2020-05-25 NOTE — Op Note (Signed)
[facility] Gastroenterology Patient Name: Renee Daniels Procedure Date: 05/25/2020 10:33 AM MRN: [id number] Account #: [id number] Date of Birth: 05/13/1966 Admit Type: Outpatient Age: 54 Room: [facility] ENDO ROOM 4 Gender: Female Note Status: Finalized Procedure:             Colonoscopy Indications:           Screening for colorectal malignant neoplasm Providers:             Wyline Mood MD, MD Referring MD:          Chryl Heck. Selena Batten (Referring MD) Medicines:             Monitored Anesthesia Care Complications:         No immediate complications. Procedure:             Pre-Anesthesia Assessment:                        - Prior to the procedure, a History and Physical was                         performed, and patient medications, allergies and                         sensitivities were reviewed. The patient's tolerance                         of previous anesthesia was reviewed.                        - The risks and benefits of the procedure and the                         sedation options and risks were discussed with the                         patient. All questions were answered and informed                         consent was obtained.                        - ASA Grade Assessment: II - A patient with mild                         systemic disease.                        After obtaining informed consent, the colonoscope was                         passed under direct vision. Throughout the procedure,                         the patient's blood pressure, pulse, and oxygen                         saturations were monitored continuously. The                         Colonoscope was introduced through the anus and  advanced to the the cecum, identified by the                         appendiceal orifice. The colonoscopy was performed                         with ease. The patient tolerated the procedure well.                         The quality of  the bowel preparation was excellent. Findings:      The perianal and digital rectal examinations were normal.      The entire examined colon appeared normal on direct and retroflexion       views. Impression:            - The entire examined colon is normal on direct and                         retroflexion views.                        - No specimens collected. Recommendation:        - Discharge patient to home (with escort).                        - Resume previous diet.                        - Continue present medications.                        - Repeat colonoscopy in 10 years for screening                         purposes. Procedure Code(s):     --- Professional ---                        [phone], Colonoscopy, flexible; diagnostic, including                         collection of specimen(s) by brushing or washing, when                         performed (separate procedure) Diagnosis Code(s):     --- Professional ---                        Z12.11, Encounter for screening for malignant neoplasm                         of colon CPT copyright 2019 American Medical Association. All rights reserved. The codes documented in this report are preliminary and upon coder review may  be revised to meet current compliance requirements. Wyline Mood, MD Wyline Mood MD, MD 05/25/2020 10:57:59 AM This report has been signed electronically. Number of Addenda: 0 Note Initiated On: 05/25/2020 10:33 AM Scope Withdrawal Time: 0 hours 11 minutes 1 second  Total Procedure Duration: 0 hours 14 minutes 52 seconds  Estimated Blood Loss:  Estimated blood loss: none.      [facility]

## 2020-05-25 NOTE — Anesthesia Postprocedure Evaluation (Signed)
Anesthesia Post Note  Patient: Renee Daniels  Procedure(s) Performed: COLONOSCOPY WITH PROPOFOL (N/A )  Patient location during evaluation: Endoscopy Anesthesia Type: General Level of consciousness: awake and alert Pain management: pain level controlled Vital Signs Assessment: post-procedure vital signs reviewed and stable Respiratory status: spontaneous breathing, nonlabored ventilation, respiratory function stable and patient connected to nasal cannula oxygen Cardiovascular status: blood pressure returned to baseline and stable Postop Assessment: no apparent nausea or vomiting Anesthetic complications: no   No complications documented.   Last Vitals:  Vitals:   05/25/20 1110 05/25/20 1128  BP: 132/78 121/63  Pulse: 63 (!) 56  Resp: 13 14  Temp:    SpO2: 100% 98%    Last Pain:  Vitals:   05/25/20 1128  PainSc: 0-No pain                 Lenard Simmer

## 2020-05-25 NOTE — Transfer of Care (Signed)
Immediate Anesthesia Transfer of Care Note  Patient: Renee Daniels  Procedure(s) Performed: COLONOSCOPY WITH PROPOFOL (N/A )  Patient Location: PACU and Endoscopy Unit  Anesthesia Type:General  Level of Consciousness: drowsy  Airway & Oxygen Therapy: Patient Spontanous Breathing  Post-op Assessment: Report given to RN  Post vital signs: stable  Last Vitals:  Vitals Value Taken Time  BP    Temp    Pulse    Resp    SpO2      Last Pain:  Vitals:   05/25/20 0937  PainSc: 0-No pain         Complications: No complications documented.

## 2020-05-25 NOTE — H&P (Signed)
Wyline Mood, MD [address], [place], [place], [address] [address], [address], [place], [place], [address] Phone: [phone]  Fax: [phone]  Primary Care Physician:  Lynnda Child, MD   Pre-Procedure History & Physical: HPI:  Angelina Venard is a 54 y.o. female is here for an colonoscopy.   History reviewed. No pertinent past medical history.  Past Surgical History:  Procedure Laterality Date  . CESAREAN SECTION     x 3  . TUBAL LIGATION      Prior to Admission medications   Medication Sig Start Date End Date Taking? Authorizing Provider  Multiple Vitamin (MULTIVITAMIN) tablet Take 1 tablet by mouth daily.   Yes [provider]  Nutritional Supplements (VITAMIN D BOOSTER PO) Take by mouth. 5000 units daily   Yes [provider]    Allergies as of 05/21/2020  . (No Known Allergies)    Family History  Problem Relation Age of Onset  . Cancer Mother        leukemia or lymphoma  . Hypertension Father   . Healthy Brother   . Healthy Daughter   . Healthy Son   . Pancreatic cancer Maternal Grandmother   . Heart disease Maternal Grandfather   . Heart disease Paternal Grandmother   . Healthy Daughter   . Lung cancer Maternal Aunt     Social History   Socioeconomic History  . Marital status: Married    Spouse name: Gregary Signs  . Number of children: 3  . Years of education: College  . Highest education level: Not on file  Occupational History  . Not on file  Tobacco Use  . Smoking status: Former Smoker    Packs/day: 0.25    Years: 17.00    Pack years: 4.25    Types: Cigarettes    Quit date: 2011    Years since quitting: 10.8  . Smokeless tobacco: Never Used  Vaping Use  . Vaping Use: Never used  Substance and Sexual Activity  . Alcohol use: Yes    Alcohol/week: 3.0 standard drinks    Types: 3 Glasses of wine per week  . Drug use: Never  . Sexual activity: Yes    Birth control/protection: Surgical  Other Topics  Concern  . 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 Difficulty of Paying Living Expenses: Not on file  Food Insecurity:   . Worried About Programme researcher, broadcasting/film/video in the Last Year: Not on file  . Ran Out of Food in the Last Year: Not on file  Transportation Needs:   . Lack of Transportation (Medical): Not on file  . Lack of Transportation (Non-Medical): Not on file  Physical Activity:   . Days of Exercise per Week: Not on file  . Minutes of Exercise per Session: Not on  file  Stress:   . Feeling of Stress : Not on file  Social Connections:   . Frequency of Communication with Friends and Family: Not on file  . Frequency of Social Gatherings with Friends and Family: Not on file  . Attends Religious Services: Not on file  . Active Member of Clubs or Organizations: Not on file  . Attends Banker Meetings: Not on file  . Marital Status: Not on file  Intimate Partner Violence:   . Fear of Current or Ex-Partner: Not on file  . Emotionally Abused: Not on file  . Physically Abused: Not on file  . Sexually Abused: Not on file    Review of Systems: See HPI, otherwise negative ROS  Physical Exam: BP (!) 141/92   Pulse 65   Temp (!) 97.2 F (36.2 C)   Resp 16   Ht 5\' 3"  (1.6 m)   Wt 121.6 kg   LMP 11/18/2018   SpO2 96%   BMI 47.47 kg/m  General:   Alert,  pleasant and cooperative in NAD Head:  Normocephalic and atraumatic. Neck:  Supple; no masses or thyromegaly. Lungs:   Clear throughout to auscultation, normal respiratory effort.    Heart:  +S1, +S2, Regular rate and rhythm, No edema. Abdomen:  Soft, nontender and nondistended. Normal bowel sounds, without guarding, and without rebound.   Neurologic:  Alert and  oriented x4;  grossly normal neurologically.  Impression/Plan: Taunia Frasco is here for an colonoscopy to be performed for Screening colonoscopy average risk   Risks, benefits, limitations, and alternatives regarding  colonoscopy have been reviewed with the patient.  Questions have been answered.  All parties agreeable.   Margorie John, MD  05/25/2020, 10:31 AM

## 2020-05-27 ENCOUNTER — Encounter: Payer: Self-pay | Admitting: Gastroenterology

## 2020-06-09 ENCOUNTER — Encounter: Payer: Self-pay | Admitting: Family Medicine

## 2020-06-09 ENCOUNTER — Ambulatory Visit (INDEPENDENT_AMBULATORY_CARE_PROVIDER_SITE_OTHER): Payer: BC Managed Care – PPO | Admitting: Family Medicine

## 2020-06-09 ENCOUNTER — Other Ambulatory Visit: Payer: Self-pay

## 2020-06-09 DIAGNOSIS — R200 Anesthesia of skin: Secondary | ICD-10-CM | POA: Diagnosis not present

## 2020-06-09 DIAGNOSIS — R202 Paresthesia of skin: Secondary | ICD-10-CM | POA: Diagnosis not present

## 2020-06-09 DIAGNOSIS — E785 Hyperlipidemia, unspecified: Secondary | ICD-10-CM | POA: Insufficient documentation

## 2020-06-09 DIAGNOSIS — G8929 Other chronic pain: Secondary | ICD-10-CM | POA: Insufficient documentation

## 2020-06-09 DIAGNOSIS — E782 Mixed hyperlipidemia: Secondary | ICD-10-CM

## 2020-06-09 DIAGNOSIS — M25561 Pain in right knee: Secondary | ICD-10-CM

## 2020-06-09 DIAGNOSIS — R7989 Other specified abnormal findings of blood chemistry: Secondary | ICD-10-CM | POA: Insufficient documentation

## 2020-06-09 NOTE — Assessment & Plan Note (Signed)
Suspect this may be nocturnal cramps. Will get blood work to look for other etiologies. Stretching before bed

## 2020-06-09 NOTE — Assessment & Plan Note (Signed)
Paperwork for bariatric surgery completed. Discussed that we could consider medication assisted weight loss if she decides to delay surgery.

## 2020-06-09 NOTE — Progress Notes (Signed)
Subjective:     Renee Daniels is a 54 y.o. female presenting for Form Completion (for bariatric surgery )     HPI  #obesity - Weight watchers x 3 with some success but regained the weight - has HLD - endorses some knee pain on the right side which is worse after prolonged sitting - also with chronic low back pain - exercise - walking previously was walking a lot, but not as much currently  #insomnia - has perimenopause - doing Vit E regimen and not sure if this helping - husband notes that she snores - partner has not commented that she stops breath - no daytime sleepiness - wakes up refreshed   #right knee pain - for years worse over the last 6-12 months - normally bad but every once in a while will have severe pain - has been noticing her feet will get numb at night - foot numbness improves with walking and stretching  Review of Systems   Social History   Tobacco Use  Smoking Status Former Smoker  . Packs/day: 0.25  . Years: 17.00  . Pack years: 4.25  . Types: Cigarettes  . Quit date: 2011  . Years since quitting: 10.9  Smokeless Tobacco Never Used        Objective:    BP Readings from Last 3 Encounters:  06/09/20 118/82  05/25/20 121/63  04/23/20 126/90   Wt Readings from Last 3 Encounters:  06/09/20 263 lb 8 oz (119.5 kg)  05/25/20 268 lb (121.6 kg)  04/23/20 266 lb (120.7 kg)    BP 118/82   Pulse 62   Temp (!) 97.1 F (36.2 C) (Temporal)   Wt 263 lb 8 oz (119.5 kg)   LMP 11/18/2018   SpO2 98%   BMI 46.68 kg/m    Physical Exam Constitutional:      General: She is not in acute distress.    Appearance: She is well-developed. She is not diaphoretic.  HENT:     Right Ear: External ear normal.     Left Ear: External ear normal.     Nose: Nose normal.  Eyes:     Conjunctiva/sclera: Conjunctivae normal.  Cardiovascular:     Rate and Rhythm: Normal rate.  Pulmonary:     Effort: Pulmonary effort is normal.  Musculoskeletal:       Cervical back: Neck supple.     Comments: Right knee Inspection: no swelling or erythema Palpation: no ttp  ROM normal Strength normal Normal ligaments, no meniscus signs  Skin:    General: Skin is warm and dry.     Capillary Refill: Capillary refill takes less than 2 seconds.  Neurological:     Mental Status: She is alert. Mental status is at baseline.  Psychiatric:        Mood and Affect: Mood normal.        Behavior: Behavior normal.           Assessment & Plan:   Problem List Items Addressed This Visit      Other   Morbid obesity (HCC) - Primary    Paperwork for bariatric surgery completed. Discussed that we could consider medication assisted weight loss if she decides to delay surgery.       Hyperlipidemia    Working on [employer] and exercise. She is considering weight loss surgery      Elevated LFTs    Will repeat labs to see if they have improved. If still elevated may consider  [place] to evaluate for liver disease      Relevant Orders   Comprehensive metabolic panel   Numbness and tingling of both legs    Suspect this may be nocturnal cramps. Will get blood work to look for other etiologies. Stretching before bed      Relevant Orders   TSH   Vitamin B12   Ferritin   Chronic pain of right knee    Suspect some arthritis given chronic and age. PT referral.       Relevant Orders   Ambulatory referral to Physical Therapy       Return in about 3 months (around 09/09/2020).  Lynnda Child, MD  This visit occurred during the SARS-CoV-2 public health emergency.  Safety protocols were in place, including screening questions prior to the visit, additional usage of staff PPE, and extensive cleaning of exam room while observing appropriate contact time as indicated for disinfecting solutions.

## 2020-06-09 NOTE — Patient Instructions (Addendum)
Could also consider weight loss medications prior to surgery  #Referral I have placed a referral to a specialist for you. You should receive a phone call from the specialty office. Make sure your voicemail is not full and that if you are able to answer your phone to unknown or new numbers.   It may take up to 2 weeks to hear about the referral. If you do not hear anything in 2 weeks, please call our office and ask to speak with the referral coordinator.

## 2020-06-09 NOTE — Assessment & Plan Note (Signed)
Suspect some arthritis given chronic and age. PT referral.

## 2020-06-09 NOTE — Assessment & Plan Note (Signed)
Working on [employer] and exercise. She is considering weight loss surgery

## 2020-06-09 NOTE — Assessment & Plan Note (Signed)
Will repeat labs to see if they have improved. If still elevated may consider [place] to evaluate for liver disease

## 2020-06-10 LAB — COMPREHENSIVE METABOLIC PANEL
ALT: 37 U/L — ABNORMAL HIGH (ref 0–35)
AST: 36 U/L (ref 0–37)
Albumin: 4.1 g/dL (ref 3.5–5.2)
Alkaline Phosphatase: 105 U/L (ref 39–117)
BUN: 18 mg/dL (ref 6–23)
CO2: 29 mEq/L (ref 19–32)
Calcium: 9.4 mg/dL (ref 8.4–10.5)
Chloride: 102 mEq/L (ref 96–112)
Creatinine, Ser: 0.81 mg/dL (ref 0.40–1.20)
GFR: 82.84 mL/min (ref >60.00)
Glucose, Bld: 79 mg/dL (ref 70–99)
Potassium: 4.2 mEq/L (ref 3.5–5.1)
Sodium: 139 mEq/L (ref 135–145)
Total Bilirubin: 0.8 mg/dL (ref 0.2–1.2)
Total Protein: 7.6 g/dL (ref 6.0–8.3)

## 2020-06-10 LAB — TSH: TSH: 1.13 u[IU]/mL (ref 0.35–4.50)

## 2020-06-10 LAB — VITAMIN B12: Vitamin B-12: 613 pg/mL (ref 211–911)

## 2020-06-10 LAB — FERRITIN: Ferritin: 180.2 ng/mL (ref 10.0–291.0)

## 2020-06-16 DIAGNOSIS — M25561 Pain in right knee: Secondary | ICD-10-CM | POA: Diagnosis not present

## 2020-06-25 ENCOUNTER — Other Ambulatory Visit: Payer: Self-pay | Admitting: Surgery

## 2020-07-15 ENCOUNTER — Ambulatory Visit
Admission: RE | Admit: 2020-07-15 | Discharge: 2020-07-15 | Disposition: A | Discharge status: Home / Self Care | Payer: BC Managed Care – PPO | Source: Ambulatory Visit | Attending: Surgery | Admitting: Surgery

## 2020-07-15 ENCOUNTER — Ambulatory Visit: Admission: RE | Admit: 2020-07-15 | Payer: BC Managed Care – PPO | Source: Home / Self Care | Admitting: Surgery

## 2020-07-15 ENCOUNTER — Other Ambulatory Visit: Payer: Self-pay

## 2020-07-15 ENCOUNTER — Ambulatory Visit: Payer: BC Managed Care – PPO | Admitting: Skilled Nursing Facility1

## 2020-07-15 DIAGNOSIS — Z01818 Encounter for other preprocedural examination: Secondary | ICD-10-CM | POA: Diagnosis not present

## 2020-07-15 DIAGNOSIS — K219 Gastro-esophageal reflux disease without esophagitis: Secondary | ICD-10-CM | POA: Diagnosis not present

## 2020-07-16 ENCOUNTER — Encounter: Payer: BC Managed Care – PPO | Attending: Surgery | Admitting: Skilled Nursing Facility1

## 2020-07-16 NOTE — Progress Notes (Signed)
Nutrition Assessment for Bariatric Surgery Medical Nutrition Therapy Appt Start Time: 9:05 End Time: 10:05  Patient was seen on 07/16/2020 for Pre-Operative Nutrition Assessment. Letter of approval faxed to [facility] bariatric surgery program coordinator on 07/16/2020  Referral stated Supervised Weight Loss (SWL) visits needed: 0; pt will return for a minimum of one visit to document change   Planned surgery: sleeve gastrectomy  Pt expectation of surgery: to maintain weight long term Pt expectation of dietitian: to help with a plan    NUTRITION ASSESSMENT   Anthropometrics  Start weight at [facility]: 273 lbs (date: 07/16/2020)  Height: 63 in BMI: 48.36 kg/m2     Clinical  Medical hx: N/A Medications: vitamin E, vitamin B12, apple cider vinegar Labs: BUN 24, AST 54, ALT 49 Notable signs/symptoms: night sweats (menapause) Any previous deficiencies? Vitamin D  Micronutrient Nutrition Focused Physical Exam: Hair: No issues observed Eyes: No issues observed Mouth: No issues observed Neck: No issues observed Nails: No issues observed Skin: No issues observed  Lifestyle & Dietary Hx  Pt states she recently started at a gym with a pan to go 3 days a week. Pt states when she spreads her meals out she loses weight. Pt state she works from home and is working through her meal times.   24-Hr Dietary Recall First Meal: skipped and coffee Snack:  Second Meal: skipped Snack:  Third Meal: beef or chicken or fish + brussels sprouts or broccoli + baked potato or rice Snack: chips Beverages: alcohol, water, coffee + sugar free creamer + splenda    Estimated Energy Needs Calories: 1500   NUTRITION DIAGNOSIS  Overweight/obesity ([place]-3.3) related to past poor dietary habits and physical inactivity as evidenced by patient w/ planned sleeve gastrectomy surgery following dietary guidelines for continued weight loss.   NUTRITION INTERVENTION  Nutrition counseling (C-1) and  education (E-2) to facilitate bariatric surgery goals.   Pre-Op Goals Reviewed with the Patient . Track food and beverage intake (pen and paper, MyFitness Pal, Baritastic app, etc.) . Make healthy food choices while monitoring portion sizes . Consume 3 meals per day or try to eat every 3-5 hours . Avoid concentrated sugars and fried foods . Keep sugar & fat in the single digits per serving on food labels . Practice CHEWING your food (aim for applesauce consistency) . Practice not drinking 15 minutes before, during, and 30 minutes after each meal and snack . Avoid all carbonated beverages (ex: soda, sparkling beverages)  . Limit caffeinated beverages (ex: coffee, tea, energy drinks) . Avoid all sugar-sweetened beverages (ex: regular soda, sports drinks)  . Avoid alcohol  . Aim for 64-100 ounces of FLUID daily (with at least half of fluid intake being plain water)  . Aim for at least 60-80 grams of PROTEIN daily . Look for a liquid protein source that contains ?15 g protein and ?5 g carbohydrate (ex: shakes, drinks, shots) . Make a list of non-food related activities . Physical activity is an important part of a healthy lifestyle so keep it moving! The goal is to reach 150 minutes of exercise per week, including cardiovascular and weight baring activity.  *Goals that are bolded indicate the pt would like to start working towards these  Handouts Provided Include  . Bariatric Surgery handouts (Nutrition Visits, Pre-Op Goals, Protein Shakes, Vitamins & Minerals)  Learning Style & Readiness for Change Teaching method utilized: Visual & Auditory  Demonstrated degree of understanding via: Teach Back  Readiness Level: contemplative  Barriers to learning/adherence to  lifestyle change: none identified   RD's Notes for Next Visit . Assess pts adherence to chosen goals     MONITORING & EVALUATION Dietary intake, weekly physical activity, body weight, and pre-op goals reached at next nutrition  visit.    Next Steps  Patient is to follow up at [facility] for Pre-Op Class >2 weeks before surgery for further nutrition education.

## 2020-08-03 ENCOUNTER — Ambulatory Visit: Payer: BC Managed Care – PPO | Admitting: Skilled Nursing Facility1

## 2020-08-03 ENCOUNTER — Encounter: Payer: BC Managed Care – PPO | Attending: Surgery | Admitting: Skilled Nursing Facility1

## 2020-08-03 NOTE — Progress Notes (Signed)
Supervised Weight Loss Visit Bariatric Nutrition Education  Planned Surgery:sleeve gastrectomy  NUTRITION ASSESSMENT  Pt has completed her visits.   Anthropometrics  Start weight at [facility]: 273 lbs (date: 07/16/2020) Today's weight: 262 lbs Weight change: 11 pounds  Clinical  Medical hx: N/A Medications: vitamin E, vitamin B12, apple cider vinegar Labs: BUN 24, AST 54, ALT 49 Notable signs/symptoms: night sweats (menapause) Any previous deficiencies? Vitamin D  Lifestyle & Dietary Hx  Pt states she has been eating breakfast and writing everything she has been eating. Pt states she has not been doing a lot of late night snacking due to sleeping better and working out more. Pt states she has not been having night sweats as often lending her to not getting up as often in the night. Pt states she also feels better. Pt stats the motivation to not have sagging skin has got her more in the gym.   Estimated daily fluid intake: unknown oz Supplements: vitamin E Current average weekly physical activity: joined a gym near her home so more convenient: 45 minutes cardio and weight lifting 3-4 times a week   24-Hr Dietary Recall First Meal: [place] sausage and 1 egg  Snack: 12:30: apple or carrots Second Meal 2-3: leftovers or [place] sandwich + popcorn  Snack: 5: nuts or popcorn Third Meal 5:30-6: broiled or baked chicken or beef + brussles + 1/2cup rice Snack:  Beverages: 1-2 coffee, water  Estimated Energy Needs Calories: 1500   NUTRITION DIAGNOSIS  Overweight/obesity ([place]-3.3) related to past poor dietary habits and physical inactivity as evidenced by patient w/ planned sleeve gastrectomy surgery following dietary guidelines for continued weight loss.   NUTRITION INTERVENTION  Nutrition counseling (C-1) and education (E-2) to facilitate bariatric surgery goals.   Learning Style & Readiness for Change Teaching method utilized: Visual & Auditory  Demonstrated degree of understanding  via: Teach Back  Readiness Level: Action Barriers to learning/adherence to lifestyle change: none identified    MONITORING & EVALUATION Dietary intake, weekly physical activity, body weight, and pre-op goals in 1 month.   Next Steps  Patient is to return to [facility] pre-op class

## 2020-08-13 DIAGNOSIS — F509 Eating disorder, unspecified: Secondary | ICD-10-CM | POA: Diagnosis not present

## 2020-09-04 DIAGNOSIS — F509 Eating disorder, unspecified: Secondary | ICD-10-CM | POA: Diagnosis not present

## 2020-09-22 ENCOUNTER — Ambulatory Visit: Payer: Self-pay | Admitting: Surgery

## 2020-09-22 NOTE — H&P (View-Only) (Signed)
Surgical Evaluation  Chief Complaint: morbid obesity  HPI: Returns for consultation regarding surgical management of morbid obesity. Denies any changes in her health since our first meeting.  She has completed the preoperative pathway with no varices identified and is scheduled for surgery on April 5.  She has several insightful questions to discuss today.  Chest x-ray/upper GI 07/15/2020: Negative, no hiatal hernia, mild reflux was noted Labs: Dietitian Jon Gills [facility] 07/16/20): approved Psychology (Dr. Cyndia Skeeters 08/13/20): approved   Initial visit 06/25/20: This is a very pleasant 55 year old woman who presents to discuss surgical management of morbid obesity. She has been struggling with this disease since childhood.  There is a strong family history of this on her mother's side of the family, as well as family history of hypertension and diabetes.  She states that several members of her mother's side of the family passed away from cancer-related illness. She has tried numerous methods of weight loss including Doylene Bode, multiple bouts of Weight Watchers, etc. and while she does have some limited success, this is transient and she regains weight once she stops with the diet plan. She reports a fairly well-balanced eating pattern although she does endorse some late night snacking. She has not been physically active as much as she would like as she has been working from home.  Additionally, her husband had a massive heart attack last year and she has spent much of her energy in being his caregiver Previous abdominal surgeries include C-section, tubal ligation.  She had a colonoscopy last month which was negative.  She is a former smoker, quit in 2011.  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 269lb/ BMI 46.54  No Known Allergies  No past medical history on file.  Past Surgical History:  Procedure Laterality Date   CESAREAN SECTION     x 3   COLONOSCOPY WITH PROPOFOL N/A 05/25/2020   Procedure:  COLONOSCOPY WITH PROPOFOL;  Surgeon: Wyline Mood, MD;  Location: [facility] ENDOSCOPY;  Service: Gastroenterology;  Laterality: N/A;   TUBAL LIGATION      Family History  Problem Relation Age of Onset   Cancer Mother        leukemia or lymphoma   Hypertension Father    Healthy Brother    Healthy Daughter    Healthy Son    Pancreatic cancer Maternal Grandmother    Heart disease Maternal Grandfather    Heart attack Maternal Grandfather 58   Heart disease Paternal Grandmother    Heart attack Paternal Grandmother 37   Healthy Daughter    Lung cancer Maternal Aunt     Social History   Socioeconomic History   Marital status: Married    Spouse name: Gregary Signs   Number of children: 3   Years of education: College   Highest education level: Not on file  Occupational History   Not on file  Tobacco Use   Smoking status: Former Smoker    Packs/day: 0.25    Years: 17.00    Pack years: 4.25    Types: Cigarettes    Quit date: 2011    Years since quitting: 11.1   Smokeless tobacco: Never Used  Building services engineer Use: Never used  Substance and Sexual Activity   Alcohol use: Yes    Alcohol/week: 3.0 standard drinks    Types: 3 Glasses of wine per week   Drug use: Never   Sexual activity: Yes    Birth control/protection: Surgical  Other Topics Concern   Not on file  Social History Narrative   12/20/18   From: [place]   Living: with Gregary Signs husband   Work: Production designer, 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 [place]      Family: 3 children - adult children, sean, seanelle and Jerrel Ivory      Enjoys: traveling, reading, bowling      Exercise: walking - not as much recently, a couple of days a week   Diet: has been better with husband      Safety   Seat belts: Yes    Guns: No   Safe in relationships: Yes    Social Determinants of Corporate investment banker Strain: Not on file  Food Insecurity: Not on file  Transportation Needs: Not on file  Physical Activity: Not on  file  Stress: Not on file  Social Connections: Not on file    Current Outpatient Medications on File Prior to Visit  Medication Sig Dispense Refill   APPLE CIDER VINEGAR PO Take 2 each by mouth daily.     Multiple Vitamin (MULTIVITAMIN) tablet Take 1 tablet by mouth daily.     Nutritional Supplements (VITAMIN D BOOSTER PO) Take by mouth. 5000 units daily     No current facility-administered medications on file prior to visit.    Review of Systems: a complete, 10pt review of systems was completed with pertinent positives and negatives as documented in the HPI  Physical Exam: Vitals Weight: 270 lb   Height: 63.75 in  Body Surface Area: 2.22 m   Body Mass Index: 46.71 kg/m   Temp.: 97.7 F    Pulse: 72 (Regular)    P.OX: 97% (Room air) BP: 124/68(Sitting, Left Arm, Standard)  Alert normal-appearing, respirations unlabored   CBC Latest Ref Rng & Units 12/20/2018  WBC 4.0 - 10.5 K/uL 7.9  Hemoglobin 12.0 - 15.0 g/dL 43.1  Hematocrit 54.0 - 46.0 % 38.2  Platelets 150.0 - 400.0 K/uL 244.0    CMP Latest Ref Rng & Units 06/09/2020 04/23/2020 12/20/2018  Glucose 70 - 99 mg/dL 79 85 79  BUN 6 - 23 mg/dL 18 08(Q) 16  Creatinine 0.40 - 1.20 mg/dL 7.61 9.50 9.32  Sodium 135 - 145 mEq/L 139 140 139  Potassium 3.5 - 5.1 mEq/L 4.2 4.4 4.0  Chloride 96 - 112 mEq/L 102 103 103  CO2 19 - 32 mEq/L 29 30 30   Calcium 8.4 - 10.5 mg/dL 9.4 9.7 9.4  Total Protein 6.0 - 8.3 g/dL 7.6 7.5 7.6  Total Bilirubin 0.2 - 1.2 mg/dL 0.8 0.5 0.8  Alkaline Phos 39 - 117 U/L 105 130(H) 89  AST 0 - 37 U/L 36 54(H) 22  ALT 0 - 35 U/L 37(H) 49(H) 30    No results found for: INR, PROTIME  Imaging: No results found.   A/P: MORBID OBESITY, UNSPECIFIED OBESITY TYPE (E66.01) Story: She remains a good candidate for sleeve gastrectomy. We had previously discussed the surgery including technical aspects, the risks of bleeding, infection, pain, scarring, injury to intra-abdominal structures, staple line leak or  abscess, chronic abdominal pain or nausea, new onset or worsened GERD, DVT/PE, pneumonia, heart attack, stroke, death, failure to reach weight loss goals and weight regain, hernia. Discussed the typical peri-, and postoperative course. Discussed the importance of lifelong behavioral changes to combat the chronic and relapsing disease which is obesity. Proceed as scheduled with sleeve gastrectomy..  For her height, to achieve a BMI less than 30 would equate to weight less than or equal  to 173lb    Patient Active Problem List   Diagnosis Date Noted   Hyperlipidemia 06/09/2020   Elevated LFTs 06/09/2020   Numbness and tingling of both legs 06/09/2020   Chronic pain of right knee 06/09/2020   Perimenopause 12/20/2018   Morbid obesity (HCC) 12/20/2018   Elevated blood pressure reading 12/20/2018       Phylliss Blakes, MD [facility]  See Loretha Stapler to contact appropriate on-call provider

## 2020-09-22 NOTE — H&P (Signed)
Surgical Evaluation  Chief Complaint: morbid obesity  HPI: Returns for consultation regarding surgical management of morbid obesity. Denies any changes in her health since our first meeting.  She has completed the preoperative pathway with no varices identified and is scheduled for surgery on April 5.  She has several insightful questions to discuss today.  Chest x-ray/upper GI 07/15/2020: Negative, no hiatal hernia, mild reflux was noted Labs: Dietitian Jon Gills [facility] 07/16/20): approved Psychology (Dr. Cyndia Skeeters 08/13/20): approved   Initial visit 06/25/20: This is a very pleasant 55 year old woman who presents to discuss surgical management of morbid obesity. She has been struggling with this disease since childhood.  There is a strong family history of this on her mother's side of the family, as well as family history of hypertension and diabetes.  She states that several members of her mother's side of the family passed away from cancer-related illness. She has tried numerous methods of weight loss including Doylene Bode, multiple bouts of Weight Watchers, etc. and while she does have some limited success, this is transient and she regains weight once she stops with the diet plan. She reports a fairly well-balanced eating pattern although she does endorse some late night snacking. She has not been physically active as much as she would like as she has been working from home.  Additionally, her husband had a massive heart attack last year and she has spent much of her energy in being his caregiver Previous abdominal surgeries include C-section, tubal ligation.  She had a colonoscopy last month which was negative.  She is a former smoker, quit in 2011.  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 269lb/ BMI 46.54  No Known Allergies  No past medical history on file.  Past Surgical History:  Procedure Laterality Date   CESAREAN SECTION     x 3   COLONOSCOPY WITH PROPOFOL N/A 05/25/2020   Procedure:  COLONOSCOPY WITH PROPOFOL;  Surgeon: Wyline Mood, MD;  Location: [facility] ENDOSCOPY;  Service: Gastroenterology;  Laterality: N/A;   TUBAL LIGATION      Family History  Problem Relation Age of Onset   Cancer Mother        leukemia or lymphoma   Hypertension Father    Healthy Brother    Healthy Daughter    Healthy Son    Pancreatic cancer Maternal Grandmother    Heart disease Maternal Grandfather    Heart attack Maternal Grandfather 58   Heart disease Paternal Grandmother    Heart attack Paternal Grandmother 37   Healthy Daughter    Lung cancer Maternal Aunt     Social History   Socioeconomic History   Marital status: Married    Spouse name: Gregary Signs   Number of children: 3   Years of education: College   Highest education level: Not on file  Occupational History   Not on file  Tobacco Use   Smoking status: Former Smoker    Packs/day: 0.25    Years: 17.00    Pack years: 4.25    Types: Cigarettes    Quit date: 2011    Years since quitting: 11.1   Smokeless tobacco: Never Used  Building services engineer Use: Never used  Substance and Sexual Activity   Alcohol use: Yes    Alcohol/week: 3.0 standard drinks    Types: 3 Glasses of wine per week   Drug use: Never   Sexual activity: Yes    Birth control/protection: Surgical  Other Topics Concern   Not on file  Social History Narrative   12/20/18   From: [place]   Living: with Sean husband   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 [place]      Family: 3 children - adult children, sean, seanelle and Gabrielle      Enjoys: traveling, reading, bowling      Exercise: walking - not as much recently, a couple of days a week   Diet: has been better with husband      Safety   Seat belts: Yes    Guns: No   Safe in relationships: Yes    Social Determinants of Health   Financial Resource Strain: Not on file  Food Insecurity: Not on file  Transportation Needs: Not on file  Physical Activity: Not on  file  Stress: Not on file  Social Connections: Not on file    Current Outpatient Medications on File Prior to Visit  Medication Sig Dispense Refill   APPLE CIDER VINEGAR PO Take 2 each by mouth daily.     Multiple Vitamin (MULTIVITAMIN) tablet Take 1 tablet by mouth daily.     Nutritional Supplements (VITAMIN D BOOSTER PO) Take by mouth. 5000 units daily     No current facility-administered medications on file prior to visit.    Review of Systems: a complete, 10pt review of systems was completed with pertinent positives and negatives as documented in the HPI  Physical Exam: Vitals Weight: 270 lb   Height: 63.75 in  Body Surface Area: 2.22 m   Body Mass Index: 46.71 kg/m   Temp.: 97.7 F    Pulse: 72 (Regular)    P.OX: 97% (Room air) BP: 124/68(Sitting, Left Arm, Standard)  Alert normal-appearing, respirations unlabored   CBC Latest Ref Rng & Units 12/20/2018  WBC 4.0 - 10.5 K/uL 7.9  Hemoglobin 12.0 - 15.0 g/dL 13.1  Hematocrit 36.0 - 46.0 % 38.2  Platelets 150.0 - 400.0 K/uL 244.0    CMP Latest Ref Rng & Units 06/09/2020 04/23/2020 12/20/2018  Glucose 70 - 99 mg/dL 79 85 79  BUN 6 - 23 mg/dL 18 24(H) 16  Creatinine 0.40 - 1.20 mg/dL 0.81 0.90 0.79  Sodium 135 - 145 mEq/L 139 140 139  Potassium 3.5 - 5.1 mEq/L 4.2 4.4 4.0  Chloride 96 - 112 mEq/L 102 103 103  CO2 19 - 32 mEq/L 29 30 30  Calcium 8.4 - 10.5 mg/dL 9.4 9.7 9.4  Total Protein 6.0 - 8.3 g/dL 7.6 7.5 7.6  Total Bilirubin 0.2 - 1.2 mg/dL 0.8 0.5 0.8  Alkaline Phos 39 - 117 U/L 105 130(H) 89  AST 0 - 37 U/L 36 54(H) 22  ALT 0 - 35 U/L 37(H) 49(H) 30    No results found for: INR, PROTIME  Imaging: No results found.   A/P: MORBID OBESITY, UNSPECIFIED OBESITY TYPE (E66.01) Story: She remains a good candidate for sleeve gastrectomy. We had previously discussed the surgery including technical aspects, the risks of bleeding, infection, pain, scarring, injury to intra-abdominal structures, staple line leak or  abscess, chronic abdominal pain or nausea, new onset or worsened GERD, DVT/PE, pneumonia, heart attack, stroke, death, failure to reach weight loss goals and weight regain, hernia. Discussed the typical peri-, and postoperative course. Discussed the importance of lifelong behavioral changes to combat the chronic and relapsing disease which is obesity. Proceed as scheduled with sleeve gastrectomy..  For her height, to achieve a BMI less than 30 would equate to weight less than or equal   to 173lb    Patient Active Problem List   Diagnosis Date Noted   Hyperlipidemia 06/09/2020   Elevated LFTs 06/09/2020   Numbness and tingling of both legs 06/09/2020   Chronic pain of right knee 06/09/2020   Perimenopause 12/20/2018   Morbid obesity (HCC) 12/20/2018   Elevated blood pressure reading 12/20/2018       Phylliss Blakes, MD [facility]  See Loretha Stapler to contact appropriate on-call provider

## 2020-09-28 ENCOUNTER — Encounter: Payer: BC Managed Care – PPO | Attending: Surgery | Admitting: Skilled Nursing Facility1

## 2020-09-28 ENCOUNTER — Other Ambulatory Visit: Payer: Self-pay

## 2020-09-29 NOTE — Progress Notes (Signed)
Pre-Operative Nutrition Class:  Appt start time: 1610   End time:  1830.  Patient was seen on 09/28/2020 for Pre-Operative Bariatric Surgery [facility]    Surgery date: 10/20/2020 Surgery type: sleeve Start weight at [facility]: 273 Weight today: 270.9  The following the learning objectives were met by the patient during this course:  Identify Pre-Op Dietary Goals and will begin 2 weeks pre-operatively  Identify appropriate sources of fluids and proteins   State protein recommendations and appropriate sources pre and post-operatively  Identify Post-Operative Dietary Goals and will follow for 2 weeks post-operatively  Identify appropriate multivitamin and calcium sources  Describe the need for physical activity post-operatively and will follow MD recommendations  State when to call healthcare provider regarding medication questions or post-operative complications  Handouts given during class include:  Pre-Op Bariatric Surgery Diet Handout  Protein Shake Handout  Post-Op Bariatric Surgery Nutrition Handout  [facility] Program Information Flyer  Support Group Information Flyer  [facility] Bariatric Supplements Price List  Follow-Up Plan: Patient will follow-up at [facility] 2 weeks post operatively for diet advancement per MD.

## 2020-10-07 NOTE — Patient Instructions (Addendum)
DUE TO COVID-19 ONLY ONE VISITOR IS ALLOWED TO COME WITH YOU AND STAY IN THE WAITING ROOM ONLY DURING PRE OP AND PROCEDURE DAY OF SURGERY. THE 1 VISITOR  MAY VISIT WITH YOU AFTER SURGERY IN YOUR PRIVATE ROOM DURING VISITING HOURS ONLY!  YOU NEED TO HAVE A COVID 19 TEST ON___4/1____ @_3 :00______, THIS TEST MUST BE DONE BEFORE SURGERY,  COVID TESTING SITE [address] [place] , IT IS ON THE RIGHT GOING OUT [address] APPROXIMATELY  2 MINUTES PAST [employer] ON THE RIGHT. ONCE YOUR COVID TEST IS COMPLETED,  PLEASE BEGIN THE QUARANTINE INSTRUCTIONS AS OUTLINED IN YOUR HANDOUT.                Renee Daniels    Your procedure is scheduled on: 10/20/20   Report to [facility] Main  Entrance   Report to Short stay at 5:30 AM     Call this number if you have problems the morning of surgery [phone]    BRUSH YOUR TEETH MORNING OF SURGERY AND RINSE YOUR MOUTH OUT, NO CHEWING GUM CANDY OR MINTS.   NO SOLID FOOD AFTER 6:00 PM THE NIGHT BEFORE YOUR SURGERY.  You may have clear liquids until 4:30 AM   PAIN IS EXPECTED AFTER SURGERY AND WILL NOT BE COMPLETELY ELIMINATED.   AMBULATION AND TYLENOL WILL HELP REDUCE INCISIONAL AND GAS PAIN. MOVEMENT IS KEY!  YOU ARE EXPECTED TO BE OUT OF BED WITHIN 4 HOURS OF ADMISSION TO YOUR PATIENT ROOM.  SITTING IN THE RECLINER THROUGHOUT THE DAY IS IMPORTANT FOR DRINKING FLUIDS AND MOVING GAS THROUGHOUT THE GI TRACT.  COMPRESSION STOCKINGS SHOULD BE WORN [facility] STAY UNLESS YOU ARE WALKING.   INCENTIVE SPIROMETER SHOULD BE USED EVERY HOUR WHILE AWAKE TO DECREASE POST-OPERATIVE COMPLICATIONS SUCH AS PNEUMONIA.  WHEN DISCHARGED HOME, IT IS IMPORTANT TO CONTINUE TO WALK EVERY HOUR AND USE THE INCENTIVE SPIROMETER EVERY HOUR.   Take these medicines the morning of surgery with A SIP OF WATER: None                                 You may not have any metal on your body including hair pins and               piercings  Do not wear jewelry, make-up, lotions, powders or perfumes, deodorant             Do not wear nail polish on your fingernails.  Do not shave  48 hours prior to surgery.                 Do not bring valuables to the hospital. [facility] IS NOT             RESPONSIBLE   FOR VALUABLES.  Contacts, dentures or bridgework may not be worn into surgery.                Please read over the following fact sheets you were given: _____________________________________________________________________             [facility] - Preparing for Surgery Before surgery, you can play an important role.  Because skin is not sterile, your skin needs to be as free of germs as possible.  You can reduce the number of germs on your skin by washing with CHG (chlorahexidine gluconate) soap before surgery.  CHG is an antiseptic cleaner which  kills germs and bonds with the skin to continue killing germs even after washing. Please DO NOT use if you have an allergy to CHG or antibacterial soaps.  If your skin becomes reddened/irritated stop using the CHG and inform your nurse when you arrive at Short Stay. Do not shave (including legs and underarms) for at least 48 hours prior to the first CHG shower.   Please follow these instructions carefully:  1.  Shower with CHG Soap the night before surgery and the  morning of Surgery.  2.  If you choose to wash your hair, wash your hair first as usual with your  normal  shampoo.  3.  After you shampoo, rinse your hair and body thoroughly to remove the  shampoo.                                        4.  Use CHG as you would any other liquid soap.  You can apply chg directly  to the skin and wash                       Gently with a scrungie or clean washcloth.  5.  Apply the CHG Soap to your body ONLY FROM THE NECK DOWN.   Do not use on face/ open                           Wound or open sores. Avoid contact with eyes, ears mouth and genitals (private parts).                        Wash face,  Genitals (private parts) with your normal soap.             6.  Wash thoroughly, paying special attention to the area where your surgery  will be performed.  7.  Thoroughly rinse your body with warm water from the neck down.  8.  DO NOT shower/wash with your normal soap after using and rinsing off  the CHG Soap.             9.  Pat yourself dry with a clean towel.            10.  Wear clean pajamas.            11.  Place clean sheets on your bed the night of your first shower and do not  sleep with pets. Day of Surgery : Do not apply any lotions/deodorants the morning of surgery.  Please wear clean clothes to the hospital/surgery center.  FAILURE TO FOLLOW THESE INSTRUCTIONS MAY RESULT IN THE CANCELLATION OF YOUR SURGERY PATIENT SIGNATURE_________________________________  NURSE SIGNATURE__________________________________  ________________________________________________________________________   Renee Daniels  An incentive spirometer is a tool that can help keep your lungs clear and active. This tool measures how well you are filling your lungs with each breath. Taking long deep breaths may help reverse or decrease the chance of developing breathing (pulmonary) problems (especially infection) following:  A long period of time when you are unable to move or be active. BEFORE THE PROCEDURE   If the spirometer includes an indicator to show your best effort, your nurse or respiratory therapist will set it to a desired goal.  If possible, sit up straight or lean slightly forward. Try not to slouch.  Hold the incentive spirometer in an upright position. INSTRUCTIONS FOR USE  1. Sit on the edge of your bed if possible, or sit up as far as you can in bed or on a chair. 2. Hold the incentive spirometer in an upright position. 3. Breathe out normally. 4. Place the mouthpiece in your mouth and seal your lips tightly around it. 5. Breathe in slowly and  as deeply as possible, raising the piston or the ball toward the top of the column. 6. Hold your breath for 3-5 seconds or for as long as possible. Allow the piston or ball to fall to the bottom of the column. 7. Remove the mouthpiece from your mouth and breathe out normally. 8. Rest for a few seconds and repeat Steps 1 through 7 at least 10 times every 1-2 hours when you are awake. Take your time and take a few normal breaths between deep breaths. 9. The spirometer may include an indicator to show your best effort. Use the indicator as a goal to work toward during each repetition. 10. After each set of 10 deep breaths, practice coughing to be sure your lungs are clear. If you have an incision (the cut made at the time of surgery), support your incision when coughing by placing a pillow or rolled up towels firmly against it. Once you are able to get out of bed, walk around indoors and cough well. You may stop using the incentive spirometer when instructed by your caregiver.  RISKS AND COMPLICATIONS  Take your time so you do not get dizzy or light-headed.  If you are in pain, you may need to take or ask for pain medication before doing incentive spirometry. It is harder to take a deep breath if you are having pain. AFTER USE  Rest and breathe slowly and easily.  It can be helpful to keep track of a log of your progress. Your caregiver can provide you with a simple table to help with this. If you are using the spirometer at home, follow these instructions: SEEK MEDICAL CARE IF:   You are having difficultly using the spirometer.  You have trouble using the spirometer as often as instructed.  Your pain medication is not giving enough relief while using the spirometer.  You develop fever of 100.5 F (38.1 C) or higher. SEEK IMMEDIATE MEDICAL CARE IF:   You cough up bloody sputum that had not been present before.  You develop fever of 102 F (38.9 C) or greater.  You develop worsening  pain at or near the incision site. MAKE SURE YOU:   Understand these instructions.  Will watch your condition.  Will get help right away if you are not doing well or get worse. Document Released: 11/14/2006 Document Revised: 09/26/2011 Document Reviewed: 01/15/2007 [facility] Patient Information 2014 [place], [place].   ________________________________________________________________________

## 2020-10-08 ENCOUNTER — Other Ambulatory Visit: Payer: Self-pay

## 2020-10-08 ENCOUNTER — Encounter (HOSPITAL_COMMUNITY)
Admission: RE | Admit: 2020-10-08 | Discharge: 2020-10-08 | Disposition: A | Discharge status: Home / Self Care | Payer: BC Managed Care – PPO | Source: Ambulatory Visit | Attending: Surgery | Admitting: Surgery

## 2020-10-08 ENCOUNTER — Encounter (HOSPITAL_COMMUNITY): Payer: Self-pay

## 2020-10-08 DIAGNOSIS — Z01812 Encounter for preprocedural laboratory examination: Secondary | ICD-10-CM | POA: Diagnosis not present

## 2020-10-08 LAB — TYPE AND SCREEN
ABO/RH(D): A POS
Antibody Screen: NEGATIVE

## 2020-10-08 LAB — COMPREHENSIVE METABOLIC PANEL
ALT: 57 U/L — ABNORMAL HIGH (ref 0–44)
AST: 61 U/L — ABNORMAL HIGH (ref 15–41)
Albumin: 4.2 g/dL (ref 3.5–5.0)
Alkaline Phosphatase: 99 U/L (ref 38–126)
Anion gap: 9 (ref 5–15)
BUN: 22 mg/dL — ABNORMAL HIGH (ref 6–20)
CO2: 23 mmol/L (ref 22–32)
Calcium: 9 mg/dL (ref 8.9–10.3)
Chloride: 106 mmol/L (ref 98–111)
Creatinine, Ser: 0.82 mg/dL (ref 0.44–1.00)
GFR, Estimated: >60 mL/min (ref >60)
Glucose, Bld: 88 mg/dL (ref 70–99)
Potassium: 4.1 mmol/L (ref 3.5–5.1)
Sodium: 138 mmol/L (ref 135–145)
Total Bilirubin: 1.1 mg/dL (ref 0.3–1.2)
Total Protein: 7.7 g/dL (ref 6.5–8.1)

## 2020-10-08 LAB — CBC WITH DIFFERENTIAL/PLATELET
Abs Immature Granulocytes: 0.01 10*3/uL (ref 0.00–0.07)
Basophils Absolute: 0 10*3/uL (ref 0.0–0.1)
Basophils Relative: 0 %
Eosinophils Absolute: 0.1 10*3/uL (ref 0.0–0.5)
Eosinophils Relative: 1 %
HCT: 37.7 % (ref 36.0–46.0)
Hemoglobin: 12.8 g/dL (ref 12.0–15.0)
Immature Granulocytes: 0 %
Lymphocytes Relative: 30 %
Lymphs Abs: 2 10*3/uL (ref 0.7–4.0)
MCH: 30 pg (ref 26.0–34.0)
MCHC: 34 g/dL (ref 30.0–36.0)
MCV: 88.3 fL (ref 80.0–100.0)
Monocytes Absolute: 0.6 10*3/uL (ref 0.1–1.0)
Monocytes Relative: 9 %
Neutro Abs: 4 10*3/uL (ref 1.7–7.7)
Neutrophils Relative %: 60 %
Platelets: 218 10*3/uL (ref 150–400)
RBC: 4.27 MIL/uL (ref 3.87–5.11)
RDW: 12.1 % (ref 11.5–15.5)
WBC: 6.7 10*3/uL (ref 4.0–10.5)
nRBC: 0 % (ref 0.0–0.2)

## 2020-10-08 NOTE — Progress Notes (Signed)
COVID Vaccine Completed:yes Date COVID Vaccine completed:09/07/19 booster 06/23/20 COVID vaccine manufacturer: Pfizer     PCP - Dr. Pixie Casino Cardiologist - none  Chest x-ray - 07/15/20-epic EKG - 07/15/20-epic Stress Test - no ECHO - no Cardiac Cath - no Pacemaker/ICD device last checked:NA  Sleep Study - no CPAP -   Fasting Blood Sugar - NA Checks Blood Sugar _____ times a day  Blood Thinner Instructions:NA Aspirin Instructions: Last Dose:  Anesthesia review:   Patient denies shortness of breath, fever, cough and chest pain at PAT appointment yes Patient verbalized understanding of instructions that were given to them at the PAT appointment. Patient was also instructed that they will need to review over the PAT instructions again at home before surgery. Yes Pt can climb 4 flights of stairs, do housework and ADLs without SOB

## 2020-10-16 ENCOUNTER — Other Ambulatory Visit (HOSPITAL_COMMUNITY)
Admission: RE | Admit: 2020-10-16 | Discharge: 2020-10-16 | Disposition: A | Discharge status: Home / Self Care | Payer: BC Managed Care – PPO | Source: Ambulatory Visit | Attending: Surgery | Admitting: Surgery

## 2020-10-16 DIAGNOSIS — Z20822 Contact with and (suspected) exposure to covid-19: Secondary | ICD-10-CM | POA: Insufficient documentation

## 2020-10-16 DIAGNOSIS — Z01812 Encounter for preprocedural laboratory examination: Secondary | ICD-10-CM | POA: Insufficient documentation

## 2020-10-16 LAB — SARS CORONAVIRUS 2 (TAT 6-24 HRS): SARS Coronavirus 2: NEGATIVE

## 2020-10-19 MED ORDER — BUPIVACAINE LIPOSOME 1.3 % IJ SUSP
20.0000 mL | Freq: Once | INTRAMUSCULAR | Status: DC
  Filled 2020-10-19: qty 20

## 2020-10-19 NOTE — Anesthesia Preprocedure Evaluation (Addendum)
Anesthesia Evaluation  Patient identified by MRN, date of birth, ID band Patient awake    Reviewed: Allergy & Precautions, NPO status , Patient's Chart, lab work & pertinent test results  History of Anesthesia Complications Negative for: history of anesthetic complications  Airway Mallampati: II  TM Distance: >3 FB Neck ROM: Full    Dental  (+) Teeth Intact   Pulmonary neg pulmonary ROS, former smoker,    Pulmonary exam normal        Cardiovascular negative cardio ROS Normal cardiovascular exam     Neuro/Psych negative neurological ROS     GI/Hepatic negative GI ROS, Neg liver ROS,   Endo/Other  Morbid obesity  Renal/GU negative Renal ROS  negative genitourinary   Musculoskeletal negative musculoskeletal ROS (+)   Abdominal   Peds  Hematology negative hematology ROS (+)   Anesthesia Other Findings   Reproductive/Obstetrics                            Anesthesia Physical Anesthesia Plan  ASA: III  Anesthesia Plan: General   Post-op Pain Management:    Induction: Intravenous  PONV Risk Score and Plan: 4 or greater and Ondansetron, Dexamethasone, Treatment may vary due to age or medical condition and Midazolam  Airway Management Planned: Oral ETT  Additional Equipment: None  Intra-op Plan:   Post-operative Plan: Extubation in OR  Informed Consent: I have reviewed the patients History and Physical, chart, labs and discussed the procedure including the risks, benefits and alternatives for the proposed anesthesia with the patient or authorized representative who has indicated his/her understanding and acceptance.     Dental advisory given  Plan Discussed with:   Anesthesia Plan Comments:        Anesthesia Quick Evaluation

## 2020-10-20 ENCOUNTER — Inpatient Hospital Stay (HOSPITAL_COMMUNITY): Payer: BC Managed Care – PPO | Admitting: Certified Registered Nurse Anesthetist

## 2020-10-20 ENCOUNTER — Other Ambulatory Visit: Payer: Self-pay

## 2020-10-20 ENCOUNTER — Encounter (HOSPITAL_COMMUNITY)
Admission: RE | Disposition: A | Discharge status: Home / Self Care | Payer: Self-pay | Source: Home / Self Care | Attending: Surgery

## 2020-10-20 ENCOUNTER — Encounter (HOSPITAL_COMMUNITY): Payer: Self-pay | Admitting: Surgery

## 2020-10-20 ENCOUNTER — Inpatient Hospital Stay (HOSPITAL_COMMUNITY)
Admission: RE | Admit: 2020-10-20 | Discharge: 2020-10-21 | DRG: 621 | Disposition: A | Discharge status: Home / Self Care | Payer: BC Managed Care – PPO | Attending: Surgery | Admitting: Surgery

## 2020-10-20 DIAGNOSIS — E785 Hyperlipidemia, unspecified: Secondary | ICD-10-CM | POA: Diagnosis present

## 2020-10-20 DIAGNOSIS — Z20822 Contact with and (suspected) exposure to covid-19: Secondary | ICD-10-CM | POA: Diagnosis not present

## 2020-10-20 DIAGNOSIS — Z87891 Personal history of nicotine dependence: Secondary | ICD-10-CM

## 2020-10-20 DIAGNOSIS — Z807 Family history of other malignant neoplasms of lymphoid, hematopoietic and related tissues: Secondary | ICD-10-CM

## 2020-10-20 DIAGNOSIS — Z800 Family history of malignant neoplasm of digestive organs: Secondary | ICD-10-CM | POA: Diagnosis not present

## 2020-10-20 DIAGNOSIS — Z806 Family history of leukemia: Secondary | ICD-10-CM

## 2020-10-20 DIAGNOSIS — Z6841 Body Mass Index (BMI) 40.0 and over, adult: Secondary | ICD-10-CM | POA: Diagnosis not present

## 2020-10-20 HISTORY — PX: UPPER GI ENDOSCOPY: SHX6162

## 2020-10-20 HISTORY — PX: LAPAROSCOPIC GASTRIC SLEEVE RESECTION: SHX5895

## 2020-10-20 LAB — HEMOGLOBIN AND HEMATOCRIT, BLOOD
HCT: 34.9 % — ABNORMAL LOW (ref 36.0–46.0)
Hemoglobin: 11.8 g/dL — ABNORMAL LOW (ref 12.0–15.0)

## 2020-10-20 LAB — ABO/RH: ABO/RH(D): A POS

## 2020-10-20 LAB — HCG, SERUM, QUALITATIVE: Preg, Serum: NEGATIVE

## 2020-10-20 SURGERY — GASTRECTOMY, SLEEVE, LAPAROSCOPIC
Anesthesia: General

## 2020-10-20 MED ORDER — CHLORHEXIDINE GLUCONATE 4 % EX LIQD: 60.0000 mL | Freq: Once | CUTANEOUS | Status: DC

## 2020-10-20 MED ORDER — FENTANYL CITRATE (PF) 100 MCG/2ML IJ SOLN
INTRAMUSCULAR | Status: DC | PRN
  Administered 2020-10-20: 100 ug via INTRAVENOUS

## 2020-10-20 MED ORDER — SUGAMMADEX SODIUM 200 MG/2ML IV SOLN
INTRAVENOUS | Status: DC | PRN
  Administered 2020-10-20: 250 mg via INTRAVENOUS

## 2020-10-20 MED ORDER — HYDROMORPHONE HCL 1 MG/ML IJ SOLN
0.5000 mg | INTRAMUSCULAR | Status: DC | PRN
Start: 2020-10-20 — End: 2020-10-21

## 2020-10-20 MED ORDER — GABAPENTIN 100 MG PO CAPS
200.0000 mg | ORAL_CAPSULE | Freq: Two times a day (BID) | ORAL | Status: DC
  Administered 2020-10-20 – 2020-10-21 (×2): 200 mg via ORAL
  Filled 2020-10-20 (×2): qty 2

## 2020-10-20 MED ORDER — ONDANSETRON HCL 4 MG/2ML IJ SOLN
INTRAMUSCULAR | Status: DC | PRN
  Administered 2020-10-20: 4 mg via INTRAVENOUS

## 2020-10-20 MED ORDER — SUGAMMADEX SODIUM 500 MG/5ML IV SOLN
INTRAVENOUS | Status: AC
  Filled 2020-10-20: qty 5

## 2020-10-20 MED ORDER — OXYCODONE HCL 5 MG/5ML PO SOLN
5.0000 mg | Freq: Four times a day (QID) | ORAL | Status: DC | PRN
  Administered 2020-10-20: 5 mg via ORAL
  Filled 2020-10-20: qty 5

## 2020-10-20 MED ORDER — BUPIVACAINE-EPINEPHRINE (PF) 0.25% -1:200000 IJ SOLN
INTRAMUSCULAR | Status: AC
  Filled 2020-10-20: qty 30

## 2020-10-20 MED ORDER — LIDOCAINE 2% (20 MG/ML) 5 ML SYRINGE
INTRAMUSCULAR | Status: DC | PRN
  Administered 2020-10-20: 40 mg via INTRAVENOUS

## 2020-10-20 MED ORDER — METOPROLOL TARTRATE 5 MG/5ML IV SOLN
5.0000 mg | Freq: Four times a day (QID) | INTRAVENOUS | Status: DC | PRN
  Filled 2020-10-20: qty 5

## 2020-10-20 MED ORDER — LIDOCAINE 2% (20 MG/ML) 5 ML SYRINGE
INTRAMUSCULAR | Status: DC | PRN
  Administered 2020-10-20: 1.5 mg/kg/h via INTRAVENOUS

## 2020-10-20 MED ORDER — LACTATED RINGERS IV SOLN: INTRAVENOUS | Status: DC

## 2020-10-20 MED ORDER — LACTATED RINGERS IR SOLN
Status: DC | PRN
Start: 2020-10-20 — End: 2020-10-20
  Administered 2020-10-20: 1000 mL

## 2020-10-20 MED ORDER — SODIUM CHLORIDE 0.9 % IV SOLN: INTRAVENOUS | Status: DC

## 2020-10-20 MED ORDER — BUPIVACAINE-EPINEPHRINE 0.25% -1:200000 IJ SOLN
INTRAMUSCULAR | Status: DC | PRN
  Administered 2020-10-20: 30 mL

## 2020-10-20 MED ORDER — ROCURONIUM BROMIDE 10 MG/ML (PF) SYRINGE
PREFILLED_SYRINGE | INTRAVENOUS | Status: DC | PRN
  Administered 2020-10-20: 80 mg via INTRAVENOUS

## 2020-10-20 MED ORDER — CHLORHEXIDINE GLUCONATE 0.12 % MT SOLN: 15.0000 mL | Freq: Once | OROMUCOSAL | Status: AC

## 2020-10-20 MED ORDER — PROPOFOL 10 MG/ML IV BOLUS
INTRAVENOUS | Status: DC | PRN
  Administered 2020-10-20: 200 mg via INTRAVENOUS

## 2020-10-20 MED ORDER — ONDANSETRON HCL 4 MG/2ML IJ SOLN
4.0000 mg | INTRAMUSCULAR | Status: DC | PRN
  Administered 2020-10-20: 4 mg via INTRAVENOUS
  Filled 2020-10-20: qty 2

## 2020-10-20 MED ORDER — ACETAMINOPHEN 500 MG PO TABS
1000.0000 mg | ORAL_TABLET | ORAL | Status: AC
  Administered 2020-10-20: 1000 mg via ORAL
  Filled 2020-10-20: qty 2

## 2020-10-20 MED ORDER — KETAMINE HCL 10 MG/ML IJ SOLN
INTRAMUSCULAR | Status: DC | PRN
  Administered 2020-10-20: 25 mg via INTRAVENOUS

## 2020-10-20 MED ORDER — METHOCARBAMOL 1000 MG/10ML IJ SOLN
500.0000 mg | Freq: Four times a day (QID) | INTRAVENOUS | Status: DC | PRN
  Filled 2020-10-20: qty 5

## 2020-10-20 MED ORDER — FENTANYL CITRATE (PF) 100 MCG/2ML IJ SOLN
25.0000 ug | INTRAMUSCULAR | Status: DC | PRN
  Administered 2020-10-20 (×2): 50 ug via INTRAVENOUS

## 2020-10-20 MED ORDER — HYDRALAZINE HCL 20 MG/ML IJ SOLN: 10.0000 mg | INTRAMUSCULAR | Status: DC | PRN

## 2020-10-20 MED ORDER — APREPITANT 40 MG PO CAPS
40.0000 mg | ORAL_CAPSULE | ORAL | Status: AC
  Administered 2020-10-20: 40 mg via ORAL
  Filled 2020-10-20: qty 1

## 2020-10-20 MED ORDER — KETOROLAC TROMETHAMINE 15 MG/ML IJ SOLN
15.0000 mg | Freq: Three times a day (TID) | INTRAMUSCULAR | Status: DC | PRN
  Administered 2020-10-20: 15 mg via INTRAVENOUS
  Filled 2020-10-20: qty 1

## 2020-10-20 MED ORDER — ACETAMINOPHEN 160 MG/5ML PO SOLN
1000.0000 mg | Freq: Three times a day (TID) | ORAL | Status: DC
  Administered 2020-10-20 – 2020-10-21 (×3): 1000 mg via ORAL
  Filled 2020-10-20 (×3): qty 40.6

## 2020-10-20 MED ORDER — MIDAZOLAM HCL 5 MG/5ML IJ SOLN
INTRAMUSCULAR | Status: DC | PRN
  Administered 2020-10-20: 2 mg via INTRAVENOUS

## 2020-10-20 MED ORDER — SODIUM CHLORIDE 0.9 % IV SOLN
2.0000 g | INTRAVENOUS | Status: AC
  Administered 2020-10-20: 2 g via INTRAVENOUS
  Filled 2020-10-20: qty 2

## 2020-10-20 MED ORDER — PHENYLEPHRINE 40 MCG/ML (10ML) SYRINGE FOR IV PUSH (FOR BLOOD PRESSURE SUPPORT)
PREFILLED_SYRINGE | INTRAVENOUS | Status: DC | PRN
  Administered 2020-10-20 (×2): 80 ug via INTRAVENOUS

## 2020-10-20 MED ORDER — SCOPOLAMINE 1 MG/3DAYS TD PT72
1.0000 | MEDICATED_PATCH | TRANSDERMAL | Status: DC
  Administered 2020-10-20: 1.5 mg via TRANSDERMAL
  Filled 2020-10-20: qty 1

## 2020-10-20 MED ORDER — GABAPENTIN 300 MG PO CAPS
300.0000 mg | ORAL_CAPSULE | ORAL | Status: AC
  Administered 2020-10-20: 300 mg via ORAL
  Filled 2020-10-20: qty 1

## 2020-10-20 MED ORDER — SIMETHICONE 80 MG PO CHEW: 80.0000 mg | CHEWABLE_TABLET | Freq: Four times a day (QID) | ORAL | Status: DC | PRN

## 2020-10-20 MED ORDER — CHLORHEXIDINE GLUCONATE 4 % EX LIQD
60.0000 mL | Freq: Once | CUTANEOUS | Status: DC
Start: 2020-10-21 — End: 2020-10-20

## 2020-10-20 MED ORDER — PROPOFOL 10 MG/ML IV BOLUS
INTRAVENOUS | Status: AC
  Filled 2020-10-20: qty 20

## 2020-10-20 MED ORDER — HEPARIN SODIUM (PORCINE) 5000 UNIT/ML IJ SOLN
5000.0000 [IU] | INTRAMUSCULAR | Status: AC
  Administered 2020-10-20: 5000 [IU] via SUBCUTANEOUS
  Filled 2020-10-20: qty 1

## 2020-10-20 MED ORDER — ONDANSETRON HCL 4 MG/2ML IJ SOLN
INTRAMUSCULAR | Status: AC
  Filled 2020-10-20: qty 2

## 2020-10-20 MED ORDER — ENSURE MAX PROTEIN PO LIQD
2.0000 [oz_av] | ORAL | Status: DC
  Administered 2020-10-21 (×3): 2 [oz_av] via ORAL

## 2020-10-20 MED ORDER — PANTOPRAZOLE SODIUM 40 MG IV SOLR
40.0000 mg | Freq: Every day | INTRAVENOUS | Status: DC
  Administered 2020-10-20: 40 mg via INTRAVENOUS
  Filled 2020-10-20: qty 40

## 2020-10-20 MED ORDER — AMISULPRIDE (ANTIEMETIC) 5 MG/2ML IV SOLN: 10.0000 mg | Freq: Once | INTRAVENOUS | Status: DC | PRN

## 2020-10-20 MED ORDER — ORAL CARE MOUTH RINSE
15.0000 mL | Freq: Once | OROMUCOSAL | Status: AC
  Administered 2020-10-20: 15 mL via OROMUCOSAL

## 2020-10-20 MED ORDER — PHENYLEPHRINE 40 MCG/ML (10ML) SYRINGE FOR IV PUSH (FOR BLOOD PRESSURE SUPPORT)
PREFILLED_SYRINGE | INTRAVENOUS | Status: AC
  Filled 2020-10-20: qty 10

## 2020-10-20 MED ORDER — LIDOCAINE 2% (20 MG/ML) 5 ML SYRINGE
INTRAMUSCULAR | Status: AC
  Filled 2020-10-20: qty 5

## 2020-10-20 MED ORDER — DEXAMETHASONE SODIUM PHOSPHATE 10 MG/ML IJ SOLN
INTRAMUSCULAR | Status: DC | PRN
  Administered 2020-10-20: 5 mg via INTRAVENOUS

## 2020-10-20 MED ORDER — DOCUSATE SODIUM 100 MG PO CAPS
100.0000 mg | ORAL_CAPSULE | Freq: Two times a day (BID) | ORAL | Status: DC
  Administered 2020-10-20 – 2020-10-21 (×3): 100 mg via ORAL
  Filled 2020-10-20 (×3): qty 1

## 2020-10-20 MED ORDER — FENTANYL CITRATE (PF) 100 MCG/2ML IJ SOLN
INTRAMUSCULAR | Status: AC
  Filled 2020-10-20: qty 2

## 2020-10-20 MED ORDER — EPHEDRINE SULFATE-NACL 50-0.9 MG/10ML-% IV SOSY
PREFILLED_SYRINGE | INTRAVENOUS | Status: DC | PRN
  Administered 2020-10-20: 5 mg via INTRAVENOUS
  Administered 2020-10-20: 15 mg via INTRAVENOUS
  Administered 2020-10-20: 20 mg via INTRAVENOUS

## 2020-10-20 MED ORDER — BUPIVACAINE LIPOSOME 1.3 % IJ SUSP
INTRAMUSCULAR | Status: DC | PRN
  Administered 2020-10-20: 20 mL

## 2020-10-20 MED ORDER — MIDAZOLAM HCL 2 MG/2ML IJ SOLN
INTRAMUSCULAR | Status: AC
  Filled 2020-10-20: qty 2

## 2020-10-20 MED ORDER — ROCURONIUM BROMIDE 10 MG/ML (PF) SYRINGE
PREFILLED_SYRINGE | INTRAVENOUS | Status: AC
  Filled 2020-10-20: qty 10

## 2020-10-20 MED ORDER — OXYCODONE HCL 5 MG PO TABS: 5.0000 mg | ORAL_TABLET | Freq: Once | ORAL | Status: DC | PRN

## 2020-10-20 MED ORDER — KETAMINE HCL 10 MG/ML IJ SOLN
INTRAMUSCULAR | Status: AC
  Filled 2020-10-20: qty 1

## 2020-10-20 MED ORDER — EPHEDRINE 5 MG/ML INJ
INTRAVENOUS | Status: AC
  Filled 2020-10-20: qty 10

## 2020-10-20 MED ORDER — DEXAMETHASONE SODIUM PHOSPHATE 10 MG/ML IJ SOLN
INTRAMUSCULAR | Status: AC
  Filled 2020-10-20: qty 1

## 2020-10-20 MED ORDER — ENOXAPARIN SODIUM 30 MG/0.3ML ~~LOC~~ SOLN
30.0000 mg | Freq: Two times a day (BID) | SUBCUTANEOUS | Status: DC
  Administered 2020-10-20 – 2020-10-21 (×2): 30 mg via SUBCUTANEOUS
  Filled 2020-10-20 (×2): qty 0.3

## 2020-10-20 MED ORDER — STERILE WATER FOR IRRIGATION IR SOLN
Status: DC | PRN
  Administered 2020-10-20: 1000 mL

## 2020-10-20 MED ORDER — METOCLOPRAMIDE HCL 5 MG/ML IJ SOLN
10.0000 mg | Freq: Four times a day (QID) | INTRAMUSCULAR | Status: DC
Start: 2020-10-20 — End: 2020-10-21
  Administered 2020-10-20 – 2020-10-21 (×4): 10 mg via INTRAVENOUS
  Filled 2020-10-20 (×4): qty 2

## 2020-10-20 MED ORDER — FENTANYL CITRATE (PF) 250 MCG/5ML IJ SOLN
INTRAMUSCULAR | Status: AC
  Filled 2020-10-20: qty 5

## 2020-10-20 MED ORDER — TRAMADOL HCL 50 MG PO TABS
50.0000 mg | ORAL_TABLET | Freq: Four times a day (QID) | ORAL | Status: DC | PRN
Start: 2020-10-20 — End: 2020-10-21
  Administered 2020-10-20: 50 mg via ORAL
  Filled 2020-10-20: qty 1

## 2020-10-20 MED ORDER — ONDANSETRON HCL 4 MG/2ML IJ SOLN: 4.0000 mg | Freq: Once | INTRAMUSCULAR | Status: DC | PRN

## 2020-10-20 MED ORDER — 0.9 % SODIUM CHLORIDE (POUR BTL) OPTIME
TOPICAL | Status: DC | PRN
  Administered 2020-10-20: 1000 mL

## 2020-10-20 MED ORDER — OXYCODONE HCL 5 MG/5ML PO SOLN: 5.0000 mg | Freq: Once | ORAL | Status: DC | PRN

## 2020-10-20 MED ORDER — ACETAMINOPHEN 500 MG PO TABS: 1000.0000 mg | ORAL_TABLET | Freq: Three times a day (TID) | ORAL | Status: DC

## 2020-10-20 SURGICAL SUPPLY — 67 items
APL PRP STRL LF DISP 70% ISPRP (MISCELLANEOUS) ×2
APL SKNCLS STERI-STRIP NONHPOA (GAUZE/BANDAGES/DRESSINGS) ×1
APPLIER CLIP ROT 10 11.4 M/L (STAPLE)
APPLIER CLIP ROT 13.4 12 LRG (CLIP) ×2
APR CLP LRG 13.4X12 ROT 20 MLT (CLIP) ×1
APR CLP MED LRG 11.4X10 (STAPLE)
BAG LAPAROSCOPIC 12 15 PORT 16 (BASKET) IMPLANT
BAG RETRIEVAL 12/15 (BASKET) ×2
BENZOIN TINCTURE PRP APPL 2/3 (GAUZE/BANDAGES/DRESSINGS) ×2 IMPLANT
BLADE SURG SZ11 CARB STEEL (BLADE) ×2 IMPLANT
BNDG ADH 1X3 SHEER STRL LF (GAUZE/BANDAGES/DRESSINGS) ×12 IMPLANT
BNDG ADH THN 3X1 STRL LF (GAUZE/BANDAGES/DRESSINGS) ×6
CHLORAPREP W/TINT 26 (MISCELLANEOUS) ×4 IMPLANT
CLIP APPLIE ROT 10 11.4 M/L (STAPLE) IMPLANT
CLIP APPLIE ROT 13.4 12 LRG (CLIP) IMPLANT
COVER SURGICAL LIGHT HANDLE (MISCELLANEOUS) ×2 IMPLANT
DEVICE SUT QUICK LOAD TK 5 (STAPLE) IMPLANT
DEVICE SUT TI-KNOT TK 5X26 (MISCELLANEOUS) IMPLANT
DRAPE UTILITY XL STRL (DRAPES) ×4 IMPLANT
ELECT REM PT RETURN 15FT ADLT (MISCELLANEOUS) ×2 IMPLANT
GLOVE SURG ENC MOIS LTX SZ6 (GLOVE) ×2 IMPLANT
GLOVE SURG UNDER LTX SZ6.5 (GLOVE) ×2 IMPLANT
GOWN STRL REUS W/TWL LRG LVL3 (GOWN DISPOSABLE) ×2 IMPLANT
GOWN STRL REUS W/TWL XL LVL3 (GOWN DISPOSABLE) ×4 IMPLANT
GRASPER SUT TROCAR 14GX15 (MISCELLANEOUS) ×2 IMPLANT
KIT BASIN OR (CUSTOM PROCEDURE TRAY) ×2 IMPLANT
KIT TURNOVER KIT A (KITS) ×2 IMPLANT
MARKER SKIN DUAL TIP RULER LAB (MISCELLANEOUS) ×2 IMPLANT
MAT PREVALON FULL STRYKER (MISCELLANEOUS) ×2 IMPLANT
NDL SPNL 22GX3.5 QUINCKE BK (NEEDLE) ×1 IMPLANT
NEEDLE SPNL 22GX3.5 QUINCKE BK (NEEDLE) ×2 IMPLANT
PACK UNIVERSAL I (CUSTOM PROCEDURE TRAY) ×2 IMPLANT
RELOAD ENDO STITCH (ENDOMECHANICALS) IMPLANT
RELOAD STAPLE 60 3.6 BLU REG (STAPLE) ×1 IMPLANT
RELOAD STAPLE 60 3.8 GOLD REG (STAPLE) ×1 IMPLANT
RELOAD STAPLE 60 4.1 GRN THCK (STAPLE) IMPLANT
RELOAD STAPLER BLUE 60MM (STAPLE) ×4 IMPLANT
RELOAD STAPLER GOLD 60MM (STAPLE) ×1 IMPLANT
RELOAD STAPLER GREEN 60MM (STAPLE) ×1 IMPLANT
RELOAD SUT TRIPLE-STITCH 2-0 (ENDOMECHANICALS) IMPLANT
SCISSORS LAP 5X45 EPIX DISP (ENDOMECHANICALS) ×2 IMPLANT
SET IRRIG TUBING LAPAROSCOPIC (IRRIGATION / IRRIGATOR) ×2 IMPLANT
SET TUBE SMOKE EVAC HIGH FLOW (TUBING) ×2 IMPLANT
SHEARS HARMONIC ACE PLUS 45CM (MISCELLANEOUS) ×2 IMPLANT
SLEEVE ADV FIXATION 5X100MM (TROCAR) ×4 IMPLANT
SLEEVE GASTRECTOMY 40FR VISIGI (MISCELLANEOUS) ×2 IMPLANT
SOL ANTI FOG 6CC (MISCELLANEOUS) ×1 IMPLANT
SOLUTION ANTI FOG 6CC (MISCELLANEOUS) ×1
SPONGE LAP 18X18 RF (DISPOSABLE) ×2 IMPLANT
STAPLER ECHELON BIOABSB 60 FLE (MISCELLANEOUS) IMPLANT
STAPLER ECHELON LONG 60 440 (INSTRUMENTS) ×2 IMPLANT
STAPLER RELOAD BLUE 60MM (STAPLE) ×8
STAPLER RELOAD GOLD 60MM (STAPLE) ×2
STAPLER RELOAD GREEN 60MM (STAPLE) ×2
STRIP CLOSURE SKIN 1/2X4 (GAUZE/BANDAGES/DRESSINGS) ×2 IMPLANT
SUT MNCRL AB 4-0 PS2 18 (SUTURE) ×2 IMPLANT
SUT SURGIDAC NAB ES-9 0 48 120 (SUTURE) IMPLANT
SUT VICRYL 0 TIES 12 18 (SUTURE) ×2 IMPLANT
SYR 10ML ECCENTRIC (SYRINGE) ×2 IMPLANT
SYR 20ML LL LF (SYRINGE) ×2 IMPLANT
TOWEL OR 17X26 10 PK STRL BLUE (TOWEL DISPOSABLE) ×2 IMPLANT
TOWEL OR NON WOVEN STRL DISP B (DISPOSABLE) ×2 IMPLANT
TROCAR ADV FIXATION 5X100MM (TROCAR) ×2 IMPLANT
TROCAR BLADELESS 15MM (ENDOMECHANICALS) ×2 IMPLANT
TROCAR BLADELESS OPT 5 100 (ENDOMECHANICALS) ×2 IMPLANT
TUBING CONNECTING 10 (TUBING) ×2 IMPLANT
TUBING ENDO SMARTCAP (MISCELLANEOUS) ×2 IMPLANT

## 2020-10-20 NOTE — Op Note (Signed)
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 Daphine Deutscher  M.D., FACS   Anesthesia: Gen.   Indications for procedure: This patient was undergoing a sleeve gastrectomy.    Description of procedure: The endoscopy was placed in the mouth and into the oropharynx and under endoscopic vision it was advanced to the esophagogastric junction.  The pouch was insufflated and the symmetry was good. I passed the scope to the antrum and then withdrew it.  .   No bleeding or leaks were detected.  The scope was withdrawn without difficulty.     Matt B. Daphine Deutscher, MD, FACS General, Bariatric, & Minimally Invasive Surgery [facility], [place]

## 2020-10-20 NOTE — Op Note (Signed)
Operative Note  Renee Daniels  [id number]  [id number]  10/20/2020   Surgeon: Berna Bue MD   Assistant: Wenda Low MD   Procedure performed: laparoscopic sleeve gastrectomy, upper endoscopy   Preop diagnosis: Morbid obesity Body mass index is 47.16 kg/m. Post-op diagnosis/intraop findings: same   Specimens: fundus Retained items: none  EBL: minimal  Complications: none   Description of procedure: After obtaining informed consent and administration of chemical DVT prophylaxis in holding, the patient was taken to the operating room and placed supine on operating room table where general endotracheal anesthesia was initiated, preoperative antibiotics were administered, SCDs applied, and a formal timeout was performed. The abdomen was prepped and draped in usual sterile fashion. Peritoneal access was gained using a Visiport technique in the left upper quadrant and insufflation to 15 mmHg ensued without issue. Gross inspection revealed no evidence of injury. Under direct visualization three more 5 mm trochars were placed in the right and left hemiabdomen and the 68mm trocar in the right paramedian upper abdomen. Bilateral laparoscopic assisted TAPS blocks were performed with Exparel diluted with 0.25 percent Marcaine with epinephrine. The patient was placed in steep Trendelenburg and the liver retractor was introduced through an incision in the upper midline and secured to the post externally to maintain the left lobe retracted anteriorly.  There is no hiatal hernia on direct inspection. Using the Harmonic scalpel, the greater curvature of the stomach was dissected away from the greater omentum and short gastric vessels were divided. This began 6 cm from the pylorus, and dissection proceeded until the left crus was clearly exposed. The 69 [place] VisiGi was then introduced and directed down towards the pylorus. This was placed to suction against the lesser curve. Serial fires of the  linear cutting stapler were then employed to create our sleeve. The first fire used a green load and ensured adequate room at the angularis incisura. One gold load and then several blue loads were then employed to create a narrow tubular stomach up to the angle of His. The excised stomach was then removed through our 15 mm trocar site within an Endo Catch bag.  The visigi was taken off of suction and a few puffs of air were introduced, inflating the sleeve. No bubbles were observed in the irrigation fluid around the stomach and the shape was noted to be evenly tubular without any undue narrowing specifically at the incisura angularis, angulation or spiraling. The visigi was then removed. Upper endoscopy was performed by the assistant surgeon and the sleeve was noted to be airtight, the staple line was hemostatic. Please see his separate note. The endoscope was removed. A small amount of oozing on the distal staple line was addressed with clips. The 15 mm trocar site fascia in the right upper abdomen was closed with a 0 Vicryl using the laparoscopic suture passer under direct visualization. The liver retractor was removed under direct visualization. The abdomen was then desufflated and all remaining trochars removed. The skin incisions were closed with subcuticular Monocryl; benzoin, Steri-Strips and Band-Aids were applied The patient was then awakened, extubated and taken to PACU in stable condition.     All counts were correct at the completion of the case.

## 2020-10-20 NOTE — Anesthesia Procedure Notes (Signed)
Procedure Name: Intubation Date/Time: 10/20/2020 7:19 AM Performed by: West Pugh, CRNA Pre-anesthesia Checklist: Patient identified, Emergency Drugs available, Suction available, Patient being monitored and Timeout performed Patient Re-evaluated:Patient Re-evaluated prior to induction Oxygen Delivery Method: Circle system utilized Preoxygenation: Pre-oxygenation with 100% oxygen Induction Type: IV induction Ventilation: Mask ventilation without difficulty Laryngoscope Size: Mac and 3 Grade View: Grade I Tube type: Oral Tube size: 7.0 mm Number of attempts: 1 Airway Equipment and Method: Stylet Placement Confirmation: ETT inserted through vocal cords under direct vision,  positive ETCO2 and breath sounds checked- equal and bilateral Secured at: 22 cm Tube secured with: Tape Dental Injury: Teeth and Oropharynx as per pre-operative assessment

## 2020-10-20 NOTE — Progress Notes (Signed)
PHARMACY CONSULT FOR:  Risk Assessment for Post-Discharge VTE Following Bariatric Surgery  Post-Discharge VTE Risk Assessment: This patient's probability of 30-day post-discharge VTE is increased due to the factors marked:   Female    Age >/=60 years    BMI >/=50 kg/m2    CHF    Dyspnea at Rest    Paraplegia  x  Non-gastric-band surgery    Operation Time >/=3 hr    Return to OR     Length of Stay >/= 3 d   Hx of VTE   Hypercoagulable condition   Significant venous stasis    Predicted probability of 30-day post-discharge VTE: 0.16%  Recommendation for Discharge: No pharmacologic prophylaxis post-discharge  Renee Daniels is a 55 y.o. female who underwent laparoscopic sleeve gastrectomy on 4/5   Case start: 0736 Case end: 0835  No Known Allergies  Patient Measurements: Height: 5\' 3"  (160 cm) Weight: 120.7 kg (266 lb 3.2 oz) IBW/kg (Calculated) : 52.4 Body mass index is 47.16 kg/m.  No results for input(s): WBC, HGB, HCT, PLT, APTT, CREATININE, LABCREA, CREATININE, CREAT24HRUR, MG, PHOS, ALBUMIN, PROT, ALBUMIN, AST, ALT, ALKPHOS, BILITOT, BILIDIR, IBILI in the last 72 hours. Estimated Creatinine Clearance: 99.8 mL/min (by C-G formula based on SCr of 0.82 mg/dL).  History reviewed. No pertinent past medical history.   Medications Prior to Admission  Medication Sig Dispense Refill Last Dose  . acetaminophen (TYLENOL) 500 MG tablet Take 1,000 mg by mouth every 6 (six) hours as needed (pain).   Past Week at Unknown time  . APPLE CIDER VINEGAR PO Take 2 tablets by mouth in the morning. Gummy   10/19/2020 at Unknown time  . Cyanocobalamin (VITAMIN B-12) 5000 MCG TBDP Take 5,000 mcg by mouth in the morning.   10/19/2020 at Unknown time  . vitamin E 180 MG (400 UNITS) capsule Take 400 Units by mouth in the morning.   10/19/2020 at Unknown time     12/19/2020, PharmD 10/20/2020,12:05 PM

## 2020-10-20 NOTE — Anesthesia Postprocedure Evaluation (Signed)
Anesthesia Post Note  Patient: Renee Daniels  Procedure(s) Performed: LAPAROSCOPIC GASTRIC SLEEVE RESECTION (N/A ) UPPER GI ENDOSCOPY (N/A )     Patient location during evaluation: PACU Anesthesia Type: General Level of consciousness: awake and alert Pain management: pain level controlled Vital Signs Assessment: post-procedure vital signs reviewed and stable Respiratory status: spontaneous breathing, nonlabored ventilation and respiratory function stable Cardiovascular status: blood pressure returned to baseline and stable Postop Assessment: no apparent nausea or vomiting Anesthetic complications: no   No complications documented.  Last Vitals:  Vitals:   10/20/20 0930 10/20/20 0944  BP: 121/74 114/73  Pulse: 68 73  Resp: 12 18  Temp: (!) 36.4 C (!) 36.3 C  SpO2: 98% 98%    Last Pain:  Vitals:   10/20/20 0944  TempSrc: Oral  PainSc: 6                  Lucretia Kern

## 2020-10-20 NOTE — Transfer of Care (Signed)
Immediate Anesthesia Transfer of Care Note  Patient: Ellean Firman  Procedure(s) Performed: LAPAROSCOPIC GASTRIC SLEEVE RESECTION (N/A ) UPPER GI ENDOSCOPY (N/A )  Patient Location: PACU  Anesthesia Type:General  Level of Consciousness: awake, drowsy and patient cooperative  Airway & Oxygen Therapy: Patient Spontanous Breathing and Patient connected to face mask oxygen  Post-op Assessment: Report given to RN and Post -op Vital signs reviewed and stable  Post vital signs: Reviewed and stable  Last Vitals:  Vitals Value Taken Time  BP 130/66 10/20/20 0849  Temp    Pulse 69 10/20/20 0851  Resp 22 10/20/20 0851  SpO2 100 % 10/20/20 0851  Vitals shown include unvalidated device data.  Last Pain:  Vitals:   10/20/20 0555  TempSrc: Oral         Complications: No complications documented.

## 2020-10-20 NOTE — Interval H&P Note (Signed)
History and Physical Interval Note:  10/20/2020 7:04 AM  Renee Daniels  has presented today for surgery, with the diagnosis of morbid obesity.  The various methods of treatment have been discussed with the patient and family. After consideration of risks, benefits and other options for treatment, the patient has consented to  Procedure(s): LAPAROSCOPIC GASTRIC SLEEVE RESECTION (N/A) UPPER GI ENDOSCOPY (N/A) as a surgical intervention.  The patient's history has been reviewed, patient examined, no change in status, stable for surgery.  I have reviewed the patient's chart and labs.  Questions were answered to the patient's satisfaction.     Karelly Dewalt Lollie Sails

## 2020-10-21 ENCOUNTER — Ambulatory Visit: Payer: BC Managed Care – PPO | Admitting: Family Medicine

## 2020-10-21 ENCOUNTER — Encounter (HOSPITAL_COMMUNITY): Payer: Self-pay | Admitting: Surgery

## 2020-10-21 LAB — CBC WITH DIFFERENTIAL/PLATELET
Abs Immature Granulocytes: 0.05 10*3/uL (ref 0.00–0.07)
Basophils Absolute: 0 10*3/uL (ref 0.0–0.1)
Basophils Relative: 0 %
Eosinophils Absolute: 0 10*3/uL (ref 0.0–0.5)
Eosinophils Relative: 0 %
HCT: 31.9 % — ABNORMAL LOW (ref 36.0–46.0)
Hemoglobin: 10.7 g/dL — ABNORMAL LOW (ref 12.0–15.0)
Immature Granulocytes: 1 %
Lymphocytes Relative: 13 %
Lymphs Abs: 1.4 10*3/uL (ref 0.7–4.0)
MCH: 29.6 pg (ref 26.0–34.0)
MCHC: 33.5 g/dL (ref 30.0–36.0)
MCV: 88.1 fL (ref 80.0–100.0)
Monocytes Absolute: 0.6 10*3/uL (ref 0.1–1.0)
Monocytes Relative: 6 %
Neutro Abs: 8.3 10*3/uL — ABNORMAL HIGH (ref 1.7–7.7)
Neutrophils Relative %: 80 %
Platelets: 194 10*3/uL (ref 150–400)
RBC: 3.62 MIL/uL — ABNORMAL LOW (ref 3.87–5.11)
RDW: 11.9 % (ref 11.5–15.5)
WBC: 10.3 10*3/uL (ref 4.0–10.5)
nRBC: 0 % (ref 0.0–0.2)

## 2020-10-21 LAB — COMPREHENSIVE METABOLIC PANEL
ALT: 31 U/L (ref 0–44)
AST: 23 U/L (ref 15–41)
Albumin: 3.3 g/dL — ABNORMAL LOW (ref 3.5–5.0)
Alkaline Phosphatase: 70 U/L (ref 38–126)
Anion gap: 7 (ref 5–15)
BUN: 11 mg/dL (ref 6–20)
CO2: 25 mmol/L (ref 22–32)
Calcium: 8.6 mg/dL — ABNORMAL LOW (ref 8.9–10.3)
Chloride: 106 mmol/L (ref 98–111)
Creatinine, Ser: 0.61 mg/dL (ref 0.44–1.00)
GFR, Estimated: >60 mL/min (ref >60)
Glucose, Bld: 93 mg/dL (ref 70–99)
Potassium: 4.3 mmol/L (ref 3.5–5.1)
Sodium: 138 mmol/L (ref 135–145)
Total Bilirubin: 0.7 mg/dL (ref 0.3–1.2)
Total Protein: 6.7 g/dL (ref 6.5–8.1)

## 2020-10-21 LAB — SURGICAL PATHOLOGY

## 2020-10-21 LAB — MAGNESIUM: Magnesium: 1.7 mg/dL (ref 1.7–2.4)

## 2020-10-21 MED ORDER — TRAMADOL HCL 50 MG PO TABS: 50.0000 mg | ORAL_TABLET | Freq: Four times a day (QID) | ORAL | 0 refills | Status: DC | PRN

## 2020-10-21 MED ORDER — PANTOPRAZOLE SODIUM 40 MG PO TBEC: 40.0000 mg | DELAYED_RELEASE_TABLET | Freq: Every day | ORAL | 0 refills | Status: DC

## 2020-10-21 MED ORDER — ACETAMINOPHEN 500 MG PO TABS: 1000.0000 mg | ORAL_TABLET | Freq: Three times a day (TID) | ORAL | 0 refills | Status: AC

## 2020-10-21 MED ORDER — GABAPENTIN 100 MG PO CAPS: 200.0000 mg | ORAL_CAPSULE | Freq: Two times a day (BID) | ORAL | 0 refills | Status: DC

## 2020-10-21 MED ORDER — DOCUSATE SODIUM 100 MG PO CAPS: 100.0000 mg | ORAL_CAPSULE | Freq: Two times a day (BID) | ORAL | 0 refills | Status: AC

## 2020-10-21 MED ORDER — ONDANSETRON 4 MG PO TBDP: 4.0000 mg | ORAL_TABLET | Freq: Four times a day (QID) | ORAL | 0 refills | Status: DC | PRN

## 2020-10-21 MED ORDER — MAGNESIUM SULFATE 2 GM/50ML IV SOLN
2.0000 g | Freq: Once | INTRAVENOUS | Status: AC
  Administered 2020-10-21: 2 g via INTRAVENOUS
  Filled 2020-10-21: qty 50

## 2020-10-21 NOTE — Discharge Instructions (Signed)
GASTRIC BYPASS / SLEEVE  Home Care Instructions  These instructions are to help you care for yourself when you go home.  Call: If you have any problems. . Call [phone] and ask for the surgeon on call . If you have an emergency related to your surgery please use the ER at [facility].  . Tell the ER staff that you are a new post-op gastric bypass or gastric sleeve patient   Signs and symptoms to report: . Severe vomiting or nausea o If you cannot handle clear liquids for longer than 1 day, call your surgeon  . Abdominal pain which does not get better after taking your pain medication . Fever greater than 100.4 F and chills . Heart rate over 100 beats a minute . Trouble breathing . Chest pain .  Redness, swelling, drainage, or foul odor at incision (surgical) sites .  If your incisions open or pull apart . Swelling or pain in calf (lower leg) . Diarrhea (Loose bowel movements that happen often), frequent watery, uncontrolled bowel movements . Constipation, (no bowel movements for 3 days) if this happens:  o Take Milk of Magnesia, 2 tablespoons by mouth, 3 times a day for 2 days if needed o Stop taking Milk of Magnesia once you have had a bowel movement o Call your doctor if constipation continues Or o Take Miralax  (instead of Milk of Magnesia) following the label instructions o Stop taking Miralax once you have had a bowel movement o Call your doctor if constipation continues . Anything you think is "abnormal for you"   Normal side effects after surgery: . Unable to sleep at night or unable to concentrate . Irritability . Being tearful (crying) or depressed These are common complaints, possibly related to your anesthesia, stress of surgery and change in lifestyle, that usually go away a few weeks after surgery.  If these feelings continue, call your medical doctor.  Wound Care: You may have surgical glue, steri-strips, or staples over your incisions after surgery . Surgical  glue:  Looks like a clear film over your incisions and will wear off a little at a time . Steri-strips : Adhesive strips of tape over your incisions. You may notice a yellowish color on the skin under the steri-strips. This is used to make the   steri-strips stick better. Do not pull the steri-strips off - let them fall off . Staples: Staples may be removed before you leave the hospital o If you go home with staples, call [facility] at for an appointment with your surgeon's nurse to have staples removed 10 days after surgery, [phone] . Showering: You may shower two (2) days after your surgery unless your surgeon tells you differently o Wash gently around incisions with warm soapy water, rinse well, and gently pat dry  o If you have a drain (tube from your incision), you may need someone to hold this while you shower  o No tub baths until staples are removed and incisions are healed     Medications: . Medications should be liquid or crushed if larger than the size of a dime . Extended release pills (medication that releases a little bit at a time through the day) should not be crushed . Depending on the size and number of medications you take, you may need to space (take a few throughout the day)/change the time you take your medications so that you do not over-fill your pouch (smaller stomach) . Make sure you follow-up with   your primary care physician to make medication changes needed during rapid weight loss and life-style changes . If you have diabetes, follow up with the doctor that orders your diabetes medication(s) within one week after surgery and check your blood sugar regularly. . Do not drive while taking narcotics (pain medications) . DO NOT take NSAID'S (Examples of NSAID's include ibuprofen, naproxen)  Diet:                    First 2 Weeks  You will see the nutritionist about two (2) weeks after your surgery. The nutritionist will increase the types of foods you can  eat if you are handling liquids well: . If you have severe vomiting or nausea and cannot handle clear liquids lasting longer than 1 day, call your surgeon  Protein Shake . Drink at least 2 ounces of shake 5-6 times per day . Each serving of protein shakes (usually 8 - 12 ounces) should have a minimum of:  o 15 grams of protein  o And no more than 5 grams of carbohydrate  . Goal for protein each day: o Men = 80 grams per day o Women = 60 grams per day . Protein powder may be added to fluids such as non-fat milk or Lactaid milk or Soy milk (limit to 35 grams added protein powder per serving)  Hydration . Slowly increase the amount of water and other clear liquids as tolerated (See Acceptable Fluids) . Slowly increase the amount of protein shake as tolerated  .  Sip fluids slowly and throughout the day . May use sugar substitutes in small amounts (no more than 6 - 8 packets per day; i.e. Splenda)  Fluid Goal . The first goal is to drink at least 8 ounces of protein shake/drink per day (or as directed by the nutritionist);  See handout from pre-op Bariatric Education Class for examples of protein shake/drink.   o Slowly increase the amount of protein shake you drink as tolerated o You may find it easier to slowly sip shakes throughout the day o It is important to get your proteins in first . Your fluid goal is to drink 64 - 100 ounces of fluid daily o It may take a few weeks to build up to this . 32 oz (or more) should be clear liquids  And  . 32 oz (or more) should be full liquids (see below for examples) . Liquids should not contain sugar, caffeine, or carbonation  Clear Liquids: . Water or Sugar-free flavored water (i.e. Fruit H2O, Propel) . Decaffeinated coffee or tea (sugar-free) . Richardine Peppers Lite, Wyler's Lite, Minute Maid Lite . Sugar-free Jell-O . Bouillon or broth . Sugar-free Popsicle:   *Less than 20 calories each; Limit 1 per day  Full Liquids: Protein Shakes/Drinks + 2  choices per day of other full liquids . Full liquids must be: o No More Than 12 grams of Carbs per serving  o No More Than 3 grams of Fat per serving . Strained low-fat cream soup . Non-Fat milk . Fat-free Lactaid Milk . Sugar-free yogurt (Dannon Lite & Fit, Greek yogurt)      Vitamins and Minerals . Start 1 day after surgery unless otherwise directed by your surgeon . Bariatric Specific Complete Multivitamins . Chewable Calcium Citrate with Vitamin D-3 (Example: 3 Chewable Calcium Plus 600 with Vitamin D-3) o Take 500 mg three (3) times a day for a total of 1500 mg each day o Do not take all 3 doses   of calcium at one time as it may cause constipation, and you can only absorb 500 mg  at a time  o Do not mix multivitamins containing iron with calcium supplements; take 2 hours apart  . Menstruating women and those at risk for anemia (a blood disease that causes weakness) may need extra iron o Talk with your doctor to see if you need more iron . If you need extra iron: Total daily Iron recommendation (including Vitamins) is 50 to 100 mg Iron/day . Do not stop taking or change any vitamins or minerals until you talk to your nutritionist or surgeon . Your nutritionist and/or surgeon must approve all vitamin and mineral supplements   Activity and Exercise: It is important to continue walking at home.  Limit your physical activity as instructed by your doctor.  During this time, use these guidelines: . Do not lift anything greater than ten (10) pounds for at least two (2) weeks . Do not go back to work or drive until your surgeon says you can . You may have sex when you feel comfortable  o It is VERY important for female patients to use a reliable birth control method; fertility often increases after surgery  o Do not get pregnant for at least 18 months . Start exercising as soon as your doctor tells you that you can o Make sure your doctor approves any physical activity . Start with a  simple walking program . Walk 5-15 minutes each day, 7 days per week.  . Slowly increase until you are walking 30-45 minutes per day Consider joining our BELT program. [phone] or email [email or web address]   Special Instructions Things to remember:  . Use your CPAP when sleeping if this applies to you, do not stop the use of CPAP unless directed by physician after a sleep study . [facility] has a free Bariatric Surgery Support Group that meets monthly, the 3rd Thursday, 6 pm.  Please review discharge information for date and location of this meeting. . It is very important to keep all follow up appointments with your surgeon, nutritionist, primary care physician, and behavioral health practitioner o After the first year, please follow up with your bariatric surgeon and nutritionist at least once a year in order to maintain best weight loss results   [facility]: [phone]  [facility]: [phone] Bariatric Nurse Coordinator: [phone]      

## 2020-10-21 NOTE — Progress Notes (Addendum)
Patient alert and oriented, pain is controlled. Patient is tolerating fluids, advanced to protein shake today, patient is tolerating well.  Reviewed Gastric sleeve discharge instructions with patient and patient is able to articulate understanding.  Provided information on BELT program, Support Group and [facility] outpatient pharmacy. All questions answered, will continue to monitor.  24hr fluid recall: . Per dehydration protocol, will call pt to f/u within one week post op.

## 2020-10-21 NOTE — Progress Notes (Signed)
Nutrition Education Note ° °Received consult for diet education for patient s/p bariatric surgery. ° °Discussed 2 week post op diet with pt. Emphasized that liquids must be non carbonated, non caffeinated, and sugar free. Fluid goals discussed. Pt to follow up with outpatient bariatric RD for further diet progression after 2 weeks. Multivitamins and minerals also reviewed. Teach back method used, pt expressed understanding, expect good compliance. ° °If nutrition issues arise, please consult RD. ° °Rache Klimaszewski, MS, RD, LDN °Inpatient Clinical Dietitian °Contact information available via Amion ° ° °

## 2020-10-21 NOTE — Progress Notes (Signed)
S: No acute events overnight. Tolerating liquids without nausea or reflux, does note epigastric pain when full. Pain is well controlled. Walking in the halls.   O: Vitals, labs, intake/output, and orders reviewed at this time. No fever, no tachycardia, normotensive. Sats 96% on RA. PO 600, UOP 1275. CMP normal, Mag 1.7, WBC 10.3 (6.7 preop), Hgb 10.7 (12.8 preop), Plt 194 (218). PRNs- toradol, tramadol, and zofran x 1 yesterday morning, oxycodone x 1 last night  Gen: A&Ox3, no distress  H&N: EOMI, atraumatic, neck supple Chest: unlabored respirations, RRR Abd: soft, nontender, nondistended, incision(s) c/d/i with no cellulitis or hematoma Ext: warm, no edema Neuro: grossly normal  Lines/tubes/drains: PIV  A/P: POD 1 s/p sleeve gastrectomy, doing well -Continue liquids -Ambulate, SCDs while in bed, lovenox prophylaxis, pulmonary toilet -Plan discharge home today   Phylliss Blakes, MD [facility], [place]

## 2020-10-21 NOTE — Discharge Summary (Signed)
Physician Discharge Summary  Renee Daniels WLN:[id number] DOB: [birth year] DOA: 10/20/2020  PCP: Lesleigh Noe, MD  Admit date: 10/20/2020 Discharge date: 10/21/20  Recommendations for Outpatient Follow-up:    Follow-up Information     Clovis Riley, MD. Go on 11/12/2020.   Specialty: General Surgery Why: at 9am. Please arrive 15 minutes prior to appointment time. Thank you. Contact information: [address] [place] [address] [phone]         Carlena Hurl, PA-C. Go on 12/08/2020.   Specialty: General Surgery Why: at 4pm for Dr. Kae Heller.  Please arrive 15 minutes prior to your appointment time.  Thank you. Contact information: [place] [address] [phone]                Discharge Diagnoses:  Active Problems:   Morbid obesity ([place])   Surgical Procedure: Laparoscopic Sleeve Gastrectomy, upper endoscopy  Discharge Condition: Good Disposition: Home  Diet recommendation: Postoperative sleeve gastrectomy diet (liquids only)  Filed Weights   10/20/20 0555  Weight: 120.7 kg     Hospital Course:  The patient was admitted for a planned laparoscopic sleeve gastrectomy. Please see operative note. Preoperatively the patient was given 5000 units of subcutaneous heparin for DVT prophylaxis. Postoperative prophylactic Lovenox dosing was started on the evening of postoperative day 0. ERAS protocol was used. On the evening of postoperative day 0, the patient was started on water and ice chips. On postoperative day 1 the patient had no fever or tachycardia and was tolerating water in their diet was gradually advanced throughout the day. The patient was ambulating without difficulty. Their vital signs are stable without fever or tachycardia. The patient had received discharge instructions and counseling. They were deemed stable for discharge and had met discharge criteria   Discharge Instructions    Allergies as  of 10/21/2020   No Known Allergies      Medication List     TAKE these medications    acetaminophen 500 MG tablet Commonly known as: TYLENOL Take 1,000 mg by mouth every 6 (six) hours as needed (pain). What changed: Another medication with the same name was added. Make sure you understand how and when to take each.   acetaminophen 500 MG tablet Commonly known as: TYLENOL Take 2 tablets (1,000 mg total) by mouth every 8 (eight) hours for 5 days. What changed: You were already taking a medication with the same name, and this prescription was added. Make sure you understand how and when to take each.   APPLE CIDER VINEGAR PO Take 2 tablets by mouth in the morning. Gummy   docusate sodium 100 MG capsule Commonly known as: Colace Take 1 capsule (100 mg total) by mouth 2 (two) times daily. Okay to decrease to once daily or stop taking if having loose bowel movements   gabapentin 100 MG capsule Commonly known as: NEURONTIN Take 2 capsules (200 mg total) by mouth every 12 (twelve) hours.   ondansetron 4 MG disintegrating tablet Commonly known as: ZOFRAN-ODT Take 1 tablet (4 mg total) by mouth every 6 (six) hours as needed for nausea or vomiting.   pantoprazole 40 MG tablet Commonly known as: PROTONIX Take 1 tablet (40 mg total) by mouth daily.   traMADol 50 MG tablet Commonly known as: ULTRAM Take 1 tablet (50 mg total) by mouth every 6 (six) hours as needed (pain).   Vitamin B-12 5000 MCG Tbdp Take 5,000 mcg by mouth in the morning.  vitamin E 180 MG (400 UNITS) capsule Take 400 Units by mouth in the morning.        Follow-up Information     Clovis Riley, MD. Go on 11/12/2020.   Specialty: General Surgery Why: at 9am. Please arrive 15 minutes prior to appointment time. Thank you. Contact information: [address] [place] [address] [phone]         Carlena Hurl, PA-C. Go on 12/08/2020.   Specialty: General Surgery Why: at 4pm  for Dr. Kae Heller.  Please arrive 15 minutes prior to your appointment time.  Thank you. Contact information: [address] [place] [address] [phone]                  The results of significant diagnostics from this hospitalization (including imaging, microbiology, ancillary and laboratory) are listed below for reference.    Significant Diagnostic Studies: No results found.  Labs: Basic Metabolic Panel: Recent Labs  Lab 10/21/20 0414  NA 138  K 4.3  CL 106  CO2 25  GLUCOSE 93  BUN 11  CREATININE 0.61  CALCIUM 8.6*  MG 1.7   Liver Function Tests: Recent Labs  Lab 10/21/20 0414  AST 23  ALT 31  ALKPHOS 70  BILITOT 0.7  PROT 6.7  ALBUMIN 3.3*    CBC: Recent Labs  Lab 10/20/20 1650 10/21/20 0414  WBC  --  10.3  NEUTROABS  --  8.3*  HGB 11.8* 10.7*  HCT 34.9* 31.9*  MCV  --  88.1  PLT  --  194    CBG: No results for input(s): GLUCAP in the last 168 hours.  Active Problems:   Morbid obesity ([place])   Signed:  Clovis Riley MD [facility], [place] 10/22/2020, 8:43 AM

## 2020-10-21 NOTE — Progress Notes (Signed)
Discharge instructions given to patient and all questions were answered.  

## 2020-10-22 ENCOUNTER — Ambulatory Visit: Payer: BC Managed Care – PPO | Admitting: Family Medicine

## 2020-10-26 ENCOUNTER — Telehealth (HOSPITAL_COMMUNITY): Payer: Self-pay | Admitting: *Deleted

## 2020-10-26 NOTE — Telephone Encounter (Signed)
1.  Tell me about your pain and pain management? Pt states that she is having some pain in her right lower abdomen that pain medication helps.  Pt states that the pain "seems to be getter better".  Pt encouraged to contact [facility] if symptoms worsen.   2.  Let's talk about fluid intake.  How much total fluid are you taking in? Pt states that she is getting in at least 60oz of fluid including protein shakes, bottled water, jello, cream soups and yogurt.  Pt encouraged to continue to sip fluids throughout the day to meet goal of 64oz.   3.  How much protein have you taken in the last 2 days? Pt states s/he is meeting her goal of 60g of protein each day with the protein shakes and protein supplements.   4.  Have you had nausea?  Tell me about when have experienced nausea and what you did to help? Pt denies nausea.   5.  Has the frequency or color changed with your urine? Pt states that she is urinating "fine" with no changes in frequency or urgency.     6.  Tell me what your incisions look like? "Incisions look fine". Pt denies a fever, chills.  Pt states incisions are not swollen, open, or draining.  Pt encouraged to call [facility] if incisions change.   7.  Have you been passing gas? BM? Pt states that she is having flatus but no BM. Pt instructed to try MoM or Miralax as instructed on discharge information. To call [facility] if not able to have BM after trying medication.     8.  If a problem or question were to arise who would you call?  Do you know contact numbers for [facility], [facility], and [facility]? Pt denies dehydration symptoms.  Pt can describe s/sx of dehydration.  Pt knows to call [facility] for surgical, [facility] for nutrition, and [facility] for non-urgent questions or concerns.   9.  How has the walking going? Pt states she is walking around and able to be active without difficulty.   10.  How are your vitamins and calcium going?  How are you taking them? Pt states that she is taking her supplements and vitamins without  difficulty.  Reminded patient that the first 30 days post-operatively are important for successful recovery.  Practice good hand hygiene, wearing a mask when appropriate (since optional in most places), and minimizing exposure to people who live outside of the home, especially if they are exhibiting any respiratory, GI, or illness-like symptoms.

## 2020-11-03 ENCOUNTER — Encounter: Payer: BC Managed Care – PPO | Attending: Surgery | Admitting: Skilled Nursing Facility1

## 2020-11-03 ENCOUNTER — Other Ambulatory Visit: Payer: Self-pay

## 2020-11-03 NOTE — Progress Notes (Signed)
2 Week Post-Operative Nutrition Class   Patient was seen on 09/11/18 for Post-Operative Nutrition education at [facility].   Pt state she has been having some nausea and headaches stating this is probably due to her not meeting her fluid needs. Pt is not exhibiting an emergent hydration.    Surgery date: 10/20/2020 Surgery type: sleeve Start weight at [facility]: 273 Weight today: 254.8   Body Composition Scale 11/03/2020  Current Body Weight 254.8  Total Body Fat % 47.1  Visceral Fat 19  Fat-Free Mass % 52.8   Total Body Water % 40.9  Muscle-Mass lbs 30.1  BMI 44.8  Body Fat Displacement          Torso  lbs 74.4         Left Leg  lbs 14.8         Right Leg  lbs 14.8         Left Arm  lbs 7.4         Right Arm   lbs 7.4      The following the learning objectives were met by the patient during this course:  Identifies Phase 3 (Soft, High Proteins) Dietary Goals and will begin from 2 weeks post-operatively to 2 months post-operatively  Identifies appropriate sources of fluids and proteins   Identifies appropriate fat sources and healthy verses unhealthy fat types    States protein recommendations and appropriate sources post-operatively  Identifies the need for appropriate texture modifications, mastication, and bite sizes when consuming solids  Identifies appropriate multivitamin and calcium sources post-operatively  Describes the need for physical activity post-operatively and will follow MD recommendations  States when to call healthcare provider regarding medication questions or post-operative complications   Handouts given during class include:  Phase 3A: Soft, High Protein Diet Handout  Phase 3 High Protein Meals  Healthy Fats   Follow-Up Plan: Patient will follow-up at [facility] in 6 weeks for 2 month post-op nutrition visit for diet advancement per MD.

## 2020-11-09 ENCOUNTER — Telehealth: Payer: Self-pay | Admitting: Skilled Nursing Facility1

## 2020-11-09 NOTE — Telephone Encounter (Signed)
RD called pt to verify fluid intake once starting soft, solid proteins 2 week post-bariatric surgery.   Daily Fluid intake: Daily Protein intake:  Concerns/issues:   LVM 

## 2020-12-22 ENCOUNTER — Other Ambulatory Visit: Payer: Self-pay

## 2020-12-22 ENCOUNTER — Encounter: Payer: BC Managed Care – PPO | Attending: Surgery | Admitting: Skilled Nursing Facility1

## 2020-12-22 NOTE — Progress Notes (Signed)
Bariatric Nutrition Follow-Up Visit Medical Nutrition Therapy    NUTRITION ASSESSMENT   Surgery date: 10/20/2020 Surgery type: sleeve Start weight at [facility]: 273 Weight today: 233.6 pounds   Body Composition Scale 11/03/2020 12/22/2020  Current Body Weight 254.8 233.6  Total Body Fat % 47.1 45.2  Visceral Fat 19 17  Fat-Free Mass % 52.8 54.7   Total Body Water % 40.9 41.8  Muscle-Mass lbs 30.1 29.8  BMI 44.8 41  Body Fat Displacement           Torso  lbs 74.4 65.4         Left Leg  lbs 14.8 13         Right Leg  lbs 14.8 13         Left Arm  lbs 7.4 6.5         Right Arm   lbs 7.4 6.5    Clinical  Medical hx: N/A Medications: vitamin E, vitamin B12, apple cider vinegar Labs: BUN 24, AST 54, ALT 49 Notable signs/symptoms: night sweats (menapause) Any previous deficiencies? Vitamin D   Lifestyle & Dietary Hx  Pt states she has been following the phases and haven't even wanted to eat outside of the phases. Pt states she always reads her nutrition facts labels. Pt states plain water makes her nausea.   Estimated daily fluid intake: 50 oz Estimated daily protein intake: 70+ g Supplements: multi and calcium  Current average weekly physical activity: 5 days a week: gym 3 45 minutes and 2 days at home weighted hoola hoop  24-Hr Dietary Recall First Meal: eggs + cheese Snack: [place] sticks + cheese Second Meal: cream of chicken soup or lean [place] + cheese wrap + mayo + mustard  Snack: [place] bites Third Meal: chicken + beans  Snack: sugar free fudgcicle or sugar free jello Beverages: gatorade zero, minute made zero, alkaline water, protein water  Post-Op Goals/ Signs/ Symptoms Using straws: no Drinking while eating: no Chewing/swallowing difficulties: no Changes in vision: no Changes to mood/headaches: no Hair loss/changes to skin/nails: no Difficulty focusing/concentrating: no Sweating: no Dizziness/lightheadedness: no Palpitations: no  Carbonated/caffeinated  beverages: no N/V/D/C/Gas: no; mirilax every 2 days  Abdominal pain: no Dumping syndrome: no    NUTRITION DIAGNOSIS  Overweight/obesity ([place]-3.3) related to past poor dietary habits and physical inactivity as evidenced by completed bariatric surgery and following dietary guidelines for continued weight loss and healthy nutrition status.     NUTRITION INTERVENTION Nutrition counseling (C-1) and education (E-2) to facilitate bariatric surgery goals, including: . Diet advancement to the next phase (phase 4) now including none starchy vegetables  . The importance of consuming adequate calories as well as certain nutrients daily due to the body's need for essential vitamins, minerals, and fats . The importance of daily physical activity and to reach a goal of at least 150 minutes of moderate to vigorous physical activity weekly (or as directed by their physician) due to benefits such as increased musculature and improved lab values . The importance of intuitive eating specifically learning hunger-satiety cues and understanding the importance of learning a new body: The importance of mindful eating to avoid grazing behaviors   Goals: -Continue to aim for a minimum of 64 fluid ounces 7 days a week with at least 30 ounces being plain water -Eat non-starchy vegetables 2 times a day 7 days a week -Start out with soft cooked vegetables today and tomorrow; if tolerated begin to eat raw vegetables or cooked including salads -Eat your 3  ounces of protein first then start in on your non-starchy vegetables; once you understand how much of your meal leads to satisfaction and not full while still eating 3 ounces of protein and non-starchy vegetables you can eat them in any order  -Continue to aim for 30 minutes of activity at least 5 times a week -Do NOT cook with/add to your food: alfredo sauce, cheese sauce, barbeque sauce, ketchup, fat back, butter, bacon grease, grease, Crisco, OR SUGAR   Handouts  Provided Include  -Phase 4  Learning Style & Readiness for Change Teaching method utilized: Visual & Auditory  Demonstrated degree of understanding via: Teach Back  Readiness Level: Action Barriers to learning/adherence to lifestyle change: none identified   RD's Notes for Next Visit . Assess adherence to pt chosen goals   MONITORING & EVALUATION Dietary intake, weekly physical activity, body weight Next Steps Patient is to follow-up in 3-4 months

## 2021-02-25 DIAGNOSIS — Z20822 Contact with and (suspected) exposure to covid-19: Secondary | ICD-10-CM | POA: Diagnosis not present

## 2021-03-04 DIAGNOSIS — Z20822 Contact with and (suspected) exposure to covid-19: Secondary | ICD-10-CM | POA: Diagnosis not present

## 2021-03-23 ENCOUNTER — Other Ambulatory Visit: Payer: Self-pay

## 2021-03-23 ENCOUNTER — Encounter: Payer: BC Managed Care – PPO | Attending: Surgery | Admitting: Skilled Nursing Facility1

## 2021-03-23 NOTE — Progress Notes (Signed)
Bariatric Nutrition Follow-Up Visit Medical Nutrition Therapy    NUTRITION ASSESSMENT   Surgery date: 10/20/2020 Surgery type: sleeve Start weight at [facility]: 273 Weight today: 209.3 pounds   Body Composition Scale 11/03/2020 12/22/2020 03/23/2021  Current Body Weight 254.8 233.6 209.3  Total Body Fat % 47.1 45.2 42.4  Visceral Fat 19 17 14   Fat-Free Mass % 52.8 54.7 57.5   Total Body Water % 40.9 41.8 43.2  Muscle-Mass lbs 30.1 29.8 29.6  BMI 44.8 41 36.7  Body Fat Displacement            Torso  lbs 74.4 65.4 54.9         Left Leg  lbs 14.8 13 10.9         Right Leg  lbs 14.8 13 10.9         Left Arm  lbs 7.4 6.5 5.4         Right Arm   lbs 7.4 6.5 5.4    Clinical  Medical hx: N/A Medications: vitamin E, vitamin B12, apple cider vinegar Labs: BUN 24, AST 54, ALT 49 Notable signs/symptoms: night sweats (menapause) Any previous deficiencies? Vitamin D   Lifestyle & Dietary Hx  Pt states since getting in more fluid she has had better bowel habits stating using a smaller water bottle works for her and set up an app for a reminder.  Pt states she pre boils some eggs for a faster option. Pt states she feels she has a pretty good variety of vegetables.  Pt states she keeps track of her exercise progress.  Pt states she has a great support system.   Estimated daily fluid intake: 60 oz Estimated daily protein intake: 70+ g Supplements: multi and calcium  Current average weekly physical activity: 5-6 days a week: gym 3 45 minutes and 2 days at home weighted hoola hoop  24-Hr Dietary Recall First Meal: eggs + cheese sometimes + sausage  Snack: [place] sticks + cheese Second Meal: chicken + green beans or broccoli Snack: [place] bites Third Meal: chicken + beans  Snack: sugar free fudgcicle or sugar free jello Beverages: gatorade zero, minute made zero, alkaline water, protein water, decaf coffee blond + protein shake + sugar free syrup   Post-Op Goals/ Signs/  Symptoms Using straws: no Drinking while eating: no Chewing/swallowing difficulties: no Changes in vision: no Changes to mood/headaches: no Hair loss/changes to skin/nails: no Difficulty focusing/concentrating: no Sweating: no Dizziness/lightheadedness: no Palpitations: no  Carbonated/caffeinated beverages: no N/V/D/C/Gas: no; mirilax every 2 days  Abdominal pain: no Dumping syndrome: no    NUTRITION DIAGNOSIS  Overweight/obesity ([place]-3.3) related to past poor dietary habits and physical inactivity as evidenced by completed bariatric surgery and following dietary guidelines for continued weight loss and healthy nutrition status.     NUTRITION INTERVENTION Nutrition counseling (C-1) and education (E-2) to facilitate bariatric surgery goals, including: Diet advancement to the next phase (phase 5) now including starchy vegetables  The importance of consuming adequate calories as well as certain nutrients daily due to the body's need for essential vitamins, minerals, and fats The importance of daily physical activity and to reach a goal of at least 150 minutes of moderate to vigorous physical activity weekly (or as directed by their physician) due to benefits such as increased musculature and improved lab values The importance of intuitive eating specifically learning hunger-satiety cues and understanding the importance of learning a new body: The importance of mindful eating to avoid grazing behaviors   Goals: -add  in starchy vegetables throughout the week   Handouts Provided Include  -Phase 5  Learning Style & Readiness for Change Teaching method utilized: Visual & Auditory  Demonstrated degree of understanding via: Teach Back  Readiness Level: Action Barriers to learning/adherence to lifestyle change: none identified   RD's Notes for Next Visit Assess adherence to pt chosen goals   MONITORING & EVALUATION Dietary intake, weekly physical activity, body weight Next  Steps Patient is to follow-up in 3-4 months

## 2021-04-01 ENCOUNTER — Other Ambulatory Visit: Payer: Self-pay

## 2021-04-01 ENCOUNTER — Ambulatory Visit (INDEPENDENT_AMBULATORY_CARE_PROVIDER_SITE_OTHER): Payer: BC Managed Care – PPO | Admitting: Family Medicine

## 2021-04-01 VITALS — BP 114/82 | HR 74 | Temp 97.0°F | Ht 62.0 in | Wt 207.8 lb

## 2021-04-01 DIAGNOSIS — Z0000 Encounter for general adult medical examination without abnormal findings: Secondary | ICD-10-CM

## 2021-04-01 DIAGNOSIS — N951 Menopausal and female climacteric states: Secondary | ICD-10-CM | POA: Diagnosis not present

## 2021-04-01 DIAGNOSIS — Z23 Encounter for immunization: Secondary | ICD-10-CM | POA: Diagnosis not present

## 2021-04-01 LAB — COMPREHENSIVE METABOLIC PANEL
ALT: 54 U/L — ABNORMAL HIGH (ref 0–35)
AST: 59 U/L — ABNORMAL HIGH (ref 0–37)
Albumin: 3.7 g/dL (ref 3.5–5.2)
Alkaline Phosphatase: 216 U/L — ABNORMAL HIGH (ref 39–117)
BUN: 15 mg/dL (ref 6–23)
CO2: 29 mEq/L (ref 19–32)
Calcium: 9.5 mg/dL (ref 8.4–10.5)
Chloride: 103 mEq/L (ref 96–112)
Creatinine, Ser: 0.72 mg/dL (ref 0.40–1.20)
GFR: 94.88 mL/min (ref >60.00)
Glucose, Bld: 72 mg/dL (ref 70–99)
Potassium: 3.8 mEq/L (ref 3.5–5.1)
Sodium: 141 mEq/L (ref 135–145)
Total Bilirubin: 0.8 mg/dL (ref 0.2–1.2)
Total Protein: 6.8 g/dL (ref 6.0–8.3)

## 2021-04-01 LAB — CBC
HCT: 36 % (ref 36.0–46.0)
Hemoglobin: 11.9 g/dL — ABNORMAL LOW (ref 12.0–15.0)
MCHC: 33.2 g/dL (ref 30.0–36.0)
MCV: 89.5 fl (ref 78.0–100.0)
Platelets: 198 10*3/uL (ref 150.0–400.0)
RBC: 4.02 Mil/uL (ref 3.87–5.11)
RDW: 12.9 % (ref 11.5–15.5)
WBC: 9.6 10*3/uL (ref 4.0–10.5)

## 2021-04-01 LAB — LIPID PANEL
Cholesterol: 194 mg/dL (ref 0–200)
HDL: 46.9 mg/dL (ref >39.00)
LDL Cholesterol: 130 mg/dL — ABNORMAL HIGH (ref 0–99)
NonHDL: 147.19
Total CHOL/HDL Ratio: 4
Triglycerides: 86 mg/dL (ref 0.0–149.0)
VLDL: 17.2 mg/dL (ref 0.0–40.0)

## 2021-04-01 MED ORDER — VENLAFAXINE HCL ER 37.5 MG PO CP24: 37.5000 mg | ORAL_CAPSULE | Freq: Every day | ORAL | 0 refills | Status: DC

## 2021-04-01 NOTE — Assessment & Plan Note (Signed)
Start effexor. Discussed risks. Will try lowest dose and increase to 75 mg if needed. Update in a few weeks

## 2021-04-01 NOTE — Patient Instructions (Signed)
#  Hot flashes - start Effexor 37.5 mg - if no improvement after 2-3 weeks can increase to two pills - stomach upset, suicidal thoughts   Reschedule nurse visit for Shingles shot

## 2021-04-01 NOTE — Progress Notes (Signed)
Annual Exam   Chief Complaint:  Chief Complaint  Patient presents with   Annual Exam    No concerns     History of Present Illness:  Ms. Renee Daniels is a 55 y.o. No obstetric history on file. who LMP was Patient's last menstrual period was 09/08/2020., presents today for her annual examination.    Recent gastric sleeve Down 63 lbs  Nutrition She does get adequate calcium and Vitamin D in her diet. Diet: small portions and healthy, high protein diet Exercise: 5 days a week, 1 hour per day  Safety The patient wears seatbelts: yes.     The patient feels safe at home and in their relationships: yes.  Dentist: no Eye doctor: yes  Menstrual:  Symptoms of menopause: hot flashes  Tired - black cohash   GYN She is single partner, contraception - post menopausal status.    Cervical Cancer Screening (21-65):   Last Pap:   October 2021 Results were: atypical squamous and HPV negative  Breast Cancer Screening (Age 5-74):  There is no FH of breast cancer. There is no FH of ovarian cancer. BRCA screening Not Indicated.  Last Mammogram: 02/2020 The patient does want a mammogram this year.    Colon Cancer Screening:  Age 45-75 yo - benefits outweigh the risk. Adults 59-85 yo who have never been screened benefit.  Benefits: 134000 people in 2016 will be diagnosed and 49,000 will die - early detection helps [place]: Complications 2/2 to colonoscopy High Risk (Colonoscopy): genetic disorder (Lynch syndrome or familial adenomatous polyposis), personal hx of IBD, previous adenomatous polyp, or previous colorectal cancer, FamHx start 10 years before the age at diagnosis, increased in males and black race  Options:  FIT - looks for hemoglobin (blood in the stool) - specific and fairly sensitive - must be done annually Cologuard - looks for DNA and blood - more sensitive - therefore can have more false positives, every 3 years Colonoscopy - every 10 years if normal - sedation,  bowl prep, must have someone drive you  Shared decision making and the patient had decided to do colonoscopy 2031.   Social History   Tobacco Use  Smoking Status Former   Packs/day: 0.25   Years: 17.00   Pack years: 4.25   Types: Cigarettes   Quit date: 2011   Years since quitting: 11.7  Smokeless Tobacco Never    Lung Cancer Screening (Ages 96-04): not applicable   Weight Wt Readings from Last 3 Encounters:  04/01/21 207 lb 12 oz (94.2 kg)  03/23/21 209 lb 4.8 oz (94.9 kg)  12/22/20 233 lb 9.6 oz (106 kg)   Patient has high BMI  BMI Readings from Last 1 Encounters:  04/01/21 38.00 kg/m     Chronic disease screening Blood pressure monitoring:  BP Readings from Last 3 Encounters:  04/01/21 114/82  10/21/20 102/71  10/08/20 (!) 151/91    Lipid Monitoring: Indication for screening: age >59, obesity, diabetes, family hx, CV risk factors.  Lipid screening: Yes  Lab Results  Component Value Date   CHOL 217 (H) 04/23/2020   HDL 58.20 04/23/2020   LDLCALC 130 (H) 04/23/2020   TRIG 144.0 04/23/2020   CHOLHDL 4 04/23/2020     Diabetes Screening: age >78, overweight, family hx, PCOS, hx of gestational diabetes, at risk ethnicity Diabetes Screening screening: Not Indicated  Lab Results  Component Value Date   HGBA1C 4.6 04/23/2020     No past medical history on file.  Past Surgical  History:  Procedure Laterality Date   CESAREAN SECTION     x 3   COLONOSCOPY WITH PROPOFOL N/A 05/25/2020   Procedure: COLONOSCOPY WITH PROPOFOL;  Surgeon: Jonathon Bellows, MD;  Location[facility] END[facility]COPY;  Service: Gastroenterology;  Laterality: N/A;   LAPAROSCOPIC GASTRIC SLEEVE RESECTION N/A 10/20/2020   Procedure: LAPAROSCOPIC GASTRIC SLEEVE RESECTION;  Surgeon: Clovis Riley, MD;  Location: [facility] ORS;  Service: General;  Laterality: N/A;   TUBAL LIGATION     UPPER GI ENDOSCOPY N/A 10/20/2020   Procedure: UPPER GI ENDOSCOPY;  Surgeon: Clovis Riley, MD;  Location: [facility] ORS;   Service: General;  Laterality: N/A;    Prior to Admission medications   Not on File    No Known Allergies  Gynecologic History: Patient's last menstrual period was 09/08/2020.  Obstetric History: No obstetric history on file.  Social History   Socioeconomic History   Marital status: Married    Spouse name: Hilliard Clark   Number of children: 3   Years of education: College   Highest education level: Not on file  Occupational History   Not on file  Tobacco Use   Smoking status: Former    Packs/day: 0.25    Years: 17.00    Pack years: 4.25    Types: Cigarettes    Quit date: 2011    Years since quitting: 11.7   Smokeless tobacco: Never  Vaping Use   Vaping Use: Never used  Substance and Sexual Activity   Alcohol use: Yes    Alcohol/week: 3.0 standard drinks    Types: 3 Glasses of wine per week   Drug use: Never   Sexual activity: Yes    Birth control/protection: Surgical  Other Topics Concern   Not on file  Social History Narrative   12/20/18   From: [place]   Living: with Hilliard Clark husband   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 [place]: 3 children - adult children, sean, seanelle and Alvie [place]      Enjoys: traveling, reading, bowling      Exercise: walking - not as much recently, a couple of days a week   Diet: has been better with husband      Safety   Seat belts: Yes    Guns: No   Safe in relationships: Yes    Social Determinants of Radio broadcast assistant Strain: Not on file  Food Insecurity: Not on file  Transportation Needs: Not on file  Physical Activity: Not on file  Stress: Not on file  Social Connections: Not on file  Intimate Partner Violence: Not on file    Family History  Problem Relation Age of Onset   Cancer Mother        leukemia or lymphoma   Hypertension Father    Healthy Brother    Healthy Daughter    Healthy Son    Pancreatic cancer Maternal Grandmother    Heart disease Maternal  Grandfather    Heart attack Maternal Grandfather 58   Heart disease Paternal Grandmother    Heart attack Paternal Grandmother 36   Healthy Daughter    Lung cancer Maternal Aunt     Review of Systems  Constitutional:  Negative for chills and fever.  HENT:  Negative for congestion and sore throat.   Eyes:  Negative for blurred vision and double vision.  Respiratory:  Negative for shortness of breath.   Cardiovascular:  Negative for chest pain.  Gastrointestinal:  Negative for heartburn, nausea and vomiting.  Genitourinary: Negative.   Musculoskeletal: Negative.  Negative for myalgias.  Skin:  Negative for rash.  Neurological:  Negative for dizziness and headaches.  Endo/Heme/Allergies:  Does not bruise/bleed easily.  Psychiatric/Behavioral:  Negative for depression. The patient is not nervous/anxious.     Physical Exam BP 114/82   Pulse 74   Temp (!) 97 F (36.1 C) (Temporal)   Ht '5\' 2"'  (1.575 m)   Wt 207 lb 12 oz (94.2 kg)   LMP 09/08/2020   SpO2 97%   BMI 38.00 kg/m    BP Readings from Last 3 Encounters:  04/01/21 114/82  10/21/20 102/71  10/08/20 (!) 151/91      Physical Exam Constitutional:      General: She is not in acute distress.    Appearance: She is well-developed. She is not diaphoretic.  HENT:     Head: Normocephalic and atraumatic.     Right Ear: External ear normal.     Left Ear: External ear normal.     Nose: Nose normal.  Eyes:     General: No scleral icterus.    Extraocular Movements: Extraocular movements intact.     Conjunctiva/sclera: Conjunctivae normal.  Cardiovascular:     Rate and Rhythm: Normal rate and regular rhythm.     Heart sounds: No murmur heard. Pulmonary:     Effort: Pulmonary effort is normal. No respiratory distress.     Breath sounds: Normal breath sounds. No wheezing.  Abdominal:     General: Bowel sounds are normal. There is no distension.     Palpations: Abdomen is soft. There is no mass.     Tenderness: There is no  abdominal tenderness. There is no guarding or rebound.  Musculoskeletal:        General: Normal range of motion.     Cervical back: Neck supple.  Lymphadenopathy:     Cervical: No cervical adenopathy.  Skin:    General: Skin is warm and dry.     Capillary Refill: Capillary refill takes less than 2 seconds.  Neurological:     Mental Status: She is alert and oriented to person, place, and time.     Deep Tendon Reflexes: Reflexes normal.  Psychiatric:        Mood and Affect: Mood normal.        Behavior: Behavior normal.    Results:  PHQ-9:  [place] Office Visit from 12/20/2018 in [employer] at [place]  PHQ-9 Total Score 3         Assessment: 55 y.o. No obstetric history on file. female here for routine annual physical examination.  Plan: Problem List Items Addressed This Visit       Cardiovascular and Mediastinum   Hot flashes due to menopause    Start effexor. Discussed risks. Will try lowest dose and increase to 75 mg if needed. Update in a few weeks      Relevant Medications   venlafaxine XR (EFFEXOR XR) 37.5 MG 24 hr capsule     Other   Perimenopause   Relevant Medications   venlafaxine XR (EFFEXOR XR) 37.5 MG 24 hr capsule   Other Visit Diagnoses     Annual physical exam    -  Primary   Relevant Orders   Comprehensive metabolic panel   CBC   Lipid panel   Need for influenza vaccination       Relevant Orders   Flu Vaccine QUAD 21moIM (Fluarix, Fluzone &  Alfiuria Quad PF) (Completed)       Screening: -- Blood pressure screen normal -- cholesterol screening: will obtain -- Weight screening: overweight: continue to monitor -- Diabetes Screening: will obtain -- Nutrition: Encouraged healthy diet  The 10-year ASCVD risk score (Arnett DK, et al., 2019) is: 1.5%   Values used to calculate the score:     Age: 20 years     Sex: Female     Is Non-Hispanic African American: No     Diabetic: No     Tobacco smoker: No     Systolic Blood  Pressure: 114 mmHg     Is BP treated: No     HDL Cholesterol: 58.2 mg/dL     Total Cholesterol: 217 mg/dL  -- Statin therapy for Age 13-75 with CVD risk >7.5%  Psych -- Depression screening (PHQ-9):  [place] Office Visit from 12/20/2018 in [place] at [place]  PHQ-9 Total Score 3        Safety -- tobacco screening: not using -- alcohol screening:  low-risk usage. -- no evidence of domestic violence or intimate partner violence.   Cancer Screening -- pap smear not collected per ASCCP guidelines -- family history of breast cancer screening: done. not at high risk. -- Mammogram -  pt will schedule -- Colon cancer (age 55+)--  up to date  Immunizations Immunization History  Administered Date(s) Administered   Hepatitis A 01/02/2016, 09/25/2016   Hepatitis B 10/15/2016, 12/17/2016, 09/07/2017   Influenza,inj,Quad PF,6+ Mos 04/23/2020, 04/01/2021   PFIZER(Purple Top)SARS-COV-2 Vaccination 10/12/2019, 11/05/2019, 06/23/2020   Tdap 05/15/2017    -- flu vaccine not up to date - given today -- TDAP q10 years up to date -- Shingles (age >58) not up to date - she will reschedule for a Friday -- Covid-19 Vaccine not up to date - encouraged getting Omicron Booster   Encouraged healthy diet and exercise. Encouraged regular vision and dental care.    Lesleigh Noe, MD

## 2021-04-28 ENCOUNTER — Telehealth: Payer: Self-pay | Admitting: Family Medicine

## 2021-04-28 ENCOUNTER — Other Ambulatory Visit: Payer: Self-pay

## 2021-04-28 ENCOUNTER — Ambulatory Visit (INDEPENDENT_AMBULATORY_CARE_PROVIDER_SITE_OTHER): Payer: BC Managed Care – PPO

## 2021-04-28 DIAGNOSIS — Z23 Encounter for immunization: Secondary | ICD-10-CM | POA: Diagnosis not present

## 2021-04-28 NOTE — Telephone Encounter (Signed)
Mychart sent to pt asking how she is doing on current dose.

## 2021-04-28 NOTE — Telephone Encounter (Signed)
  Encourage patient to contact the pharmacy for refills or they can request refills through MY[facility]  LAST APPOINTMENT DATE:  Please schedule appointment if longer than 1 year  NEXT APPOINTMENT DATE:  MEDICATION:venlafaxine XR (EFFEXOR-XR) 37.5 MG 24 hr capsule   Is the patient out of medication?   PHARMACY:[id number] - [place], [place] - [address]   Let patient know to contact pharmacy at the end of the day to make sure medication is ready.  Please notify patient to allow 48-72 hours to process  CLINICAL FILLS OUT ALL BELOW:   LAST REFILL:  QTY:  REFILL DATE:    OTHER COMMENTS:    Okay for refill?  Please advise     

## 2021-05-04 NOTE — Telephone Encounter (Signed)
Left message for patient to call back. Detailed message was left the reason for the call-ok per DPR on file

## 2021-06-09 ENCOUNTER — Ambulatory Visit: Payer: BC Managed Care – PPO

## 2021-06-09 ENCOUNTER — Other Ambulatory Visit: Payer: Self-pay | Admitting: Family Medicine

## 2021-06-09 DIAGNOSIS — N951 Menopausal and female climacteric states: Secondary | ICD-10-CM

## 2021-06-29 ENCOUNTER — Telehealth: Payer: Self-pay | Admitting: Family Medicine

## 2021-06-29 NOTE — Telephone Encounter (Signed)
Yes, please reschedule pt's NV. Also offer her a visit with any provider if she has concerns with her sick symptoms.

## 2021-06-29 NOTE — Telephone Encounter (Signed)
Pt called stating that she has the cold or flu and wanted to know if she should reschedule her nurse visit on 12/14

## 2021-06-30 ENCOUNTER — Ambulatory Visit: Payer: BC Managed Care – PPO

## 2021-07-01 ENCOUNTER — Ambulatory Visit: Payer: BC Managed Care – PPO | Admitting: Skilled Nursing Facility1

## 2021-07-21 ENCOUNTER — Other Ambulatory Visit: Payer: Self-pay | Admitting: Family Medicine

## 2021-07-21 DIAGNOSIS — Z1231 Encounter for screening mammogram for malignant neoplasm of breast: Secondary | ICD-10-CM

## 2021-08-03 ENCOUNTER — Other Ambulatory Visit: Payer: Self-pay

## 2021-08-03 ENCOUNTER — Encounter: Payer: BC Managed Care – PPO | Attending: Surgery | Admitting: Skilled Nursing Facility1

## 2021-08-03 NOTE — Progress Notes (Signed)
Bariatric Nutrition Follow-Up Visit Medical Nutrition Therapy    NUTRITION ASSESSMENT   Surgery date: 10/20/2020 Surgery type: sleeve Start weight at [facility]: 273 Weight today: 189.1 pounds   Body Composition Scale 11/03/2020 12/22/2020 03/23/2021 08/03/2021  Current Body Weight 254.8 233.6 209.3 189.1  Total Body Fat % 47.1 45.2 42.4 39.5  Visceral Fat 19 17 14 12   Fat-Free Mass % 52.8 54.7 57.5 60.4   Total Body Water % 40.9 41.8 43.2 44.7  Muscle-Mass lbs 30.1 29.8 29.6 29.4  BMI 44.8 41 36.7 33.1  Body Fat Displacement             Torso  lbs 74.4 65.4 54.9 46.1         Left Leg  lbs 14.8 13 10.9 9.2         Right Leg  lbs 14.8 13 10.9 9.2         Left Arm  lbs 7.4 6.5 5.4 4.6         Right Arm   lbs 7.4 6.5 5.4 4.6    Clinical  Medical hx: N/A Medications: vitamin E, apple cider vinegar, multi and calcium, supplement for her hot flashes, biotin   Labs:  Notable signs/symptoms: night sweats (menapause) Any previous deficiencies? Vitamin D   Lifestyle & Dietary Hx   Pt states she has a great support system.   Pt states she will wake in the middle of the night and eat a fudge bar or keto ice cream bar.  Pt states she has really enjoyed her activity level because it makes her feel good.   Estimated daily fluid intake: 60 oz Estimated daily protein intake: 70+ g Supplements: multi and calcium  Current average weekly physical activity: 5-6 days a week: gym 3 45 minutes and 2 days at home weighted hoola hoop  24-Hr Dietary Recall First Meal: 2 eggs + decaf coffee + half protein shake Snack:  Second Meal: skipped or chicken or salmon or hamburger patty + salad: romaine, tomato, cucumber + dressing Snack: sugar free hershey bar + almonds Third Meal: fish or chicken + broccoli or brussles + mashed potatoes  Snack: sugar free fudgcicle or sugar free jello or 100 calorie cheese its Snack: after having gone to bed wakes to have keto ice cream Beverages: gatorade zero, minute  made zero, alkaline water, protein water, decaf coffee blond + protein shake + sugar free syrup   Post-Op Goals/ Signs/ Symptoms Using straws: no Drinking while eating: no Chewing/swallowing difficulties: no Changes in vision: no Changes to mood/headaches: no Hair loss/changes to skin/nails: no Difficulty focusing/concentrating: no Sweating: no Dizziness/lightheadedness: no Palpitations: no  Carbonated/caffeinated beverages: no N/V/D/C/Gas: no; mirilax every 2 days  Abdominal pain: no Dumping syndrome: no    NUTRITION DIAGNOSIS  Overweight/obesity ([place]-3.3) related to past poor dietary habits and physical inactivity as evidenced by completed bariatric surgery and following dietary guidelines for continued weight loss and healthy nutrition status.     NUTRITION INTERVENTION Nutrition counseling (C-1) and education (E-2) to facilitate bariatric surgery goals, including: The importance of consuming adequate calories as well as certain nutrients daily due to the body's need for essential vitamins, minerals, and fats The importance of daily physical activity and to reach a goal of at least 150 minutes of moderate to vigorous physical activity weekly (or as directed by their physician) due to benefits such as increased musculature and improved lab values The importance of intuitive eating specifically learning hunger-satiety cues and understanding the importance of learning a  new body: The importance of mindful eating to avoid grazing behaviors  Importance of vegetables To have an overall healthy diet, adult men and women are recommended to consume anywhere from 2-3 cups of vegetables daily. Vegetables provide a wide range of vitamins and minerals such as vitamin A, vitamin C, potassium, and folic acid. According to the [employer], including fruit and vegetables daily may reduce the risk of cardiovascular disease, certain cancers, and other non-communicable diseases. Why you  need complex carbohydrates: Whole grains and other complex carbohydrates are required to have a healthy diet. Whole grains provide fiber which can help with blood glucose levels and help keep you satiated. Fruits and starchy vegetables provide essential vitamins and minerals required for immune function, eyesight support, brain support, bone density, wound healing and many other functions within the body. According to the current evidenced based 2020-2025 Dietary Guidelines for Americans, complex carbohydrates are part of a healthy eating pattern which is associated with a decreased risk for type 2 diabetes, cancers, and cardiovascular disease.    Goals: -avoid eating after having gone to bed -try to have something for lunch like a wrap -set an alarm to eat lunch -add In any fruit  Handouts Provided Include  -Phase 6  Learning Style & Readiness for Change Teaching method utilized: Visual & Auditory  Demonstrated degree of understanding via: Teach Back  Readiness Level: Action Barriers to learning/adherence to lifestyle change: none identified   RD's Notes for Next Visit Assess adherence to pt chosen goals   MONITORING & EVALUATION Dietary intake, weekly physical activity, body weight Next Steps Patient is to follow-up in April

## 2021-08-06 ENCOUNTER — Ambulatory Visit
Admission: RE | Admit: 2021-08-06 | Discharge: 2021-08-06 | Disposition: A | Discharge status: Home / Self Care | Payer: BC Managed Care – PPO | Source: Ambulatory Visit

## 2021-08-06 ENCOUNTER — Other Ambulatory Visit: Payer: Self-pay

## 2021-08-06 DIAGNOSIS — Z1231 Encounter for screening mammogram for malignant neoplasm of breast: Secondary | ICD-10-CM

## 2021-09-04 IMAGING — CR DG CHEST 2V
1 series · 2 of 2 positions shown · non-contrast
Comparison: None.

CLINICAL DATA: Morbid obesity. Pre-op evaluation for bariatric
surgery.

EXAM:
CHEST - 2 VIEW

[Series 1: dg chest 2 view · 0.14mm/px · 2 of 2 slices shown]
[im 1/2]
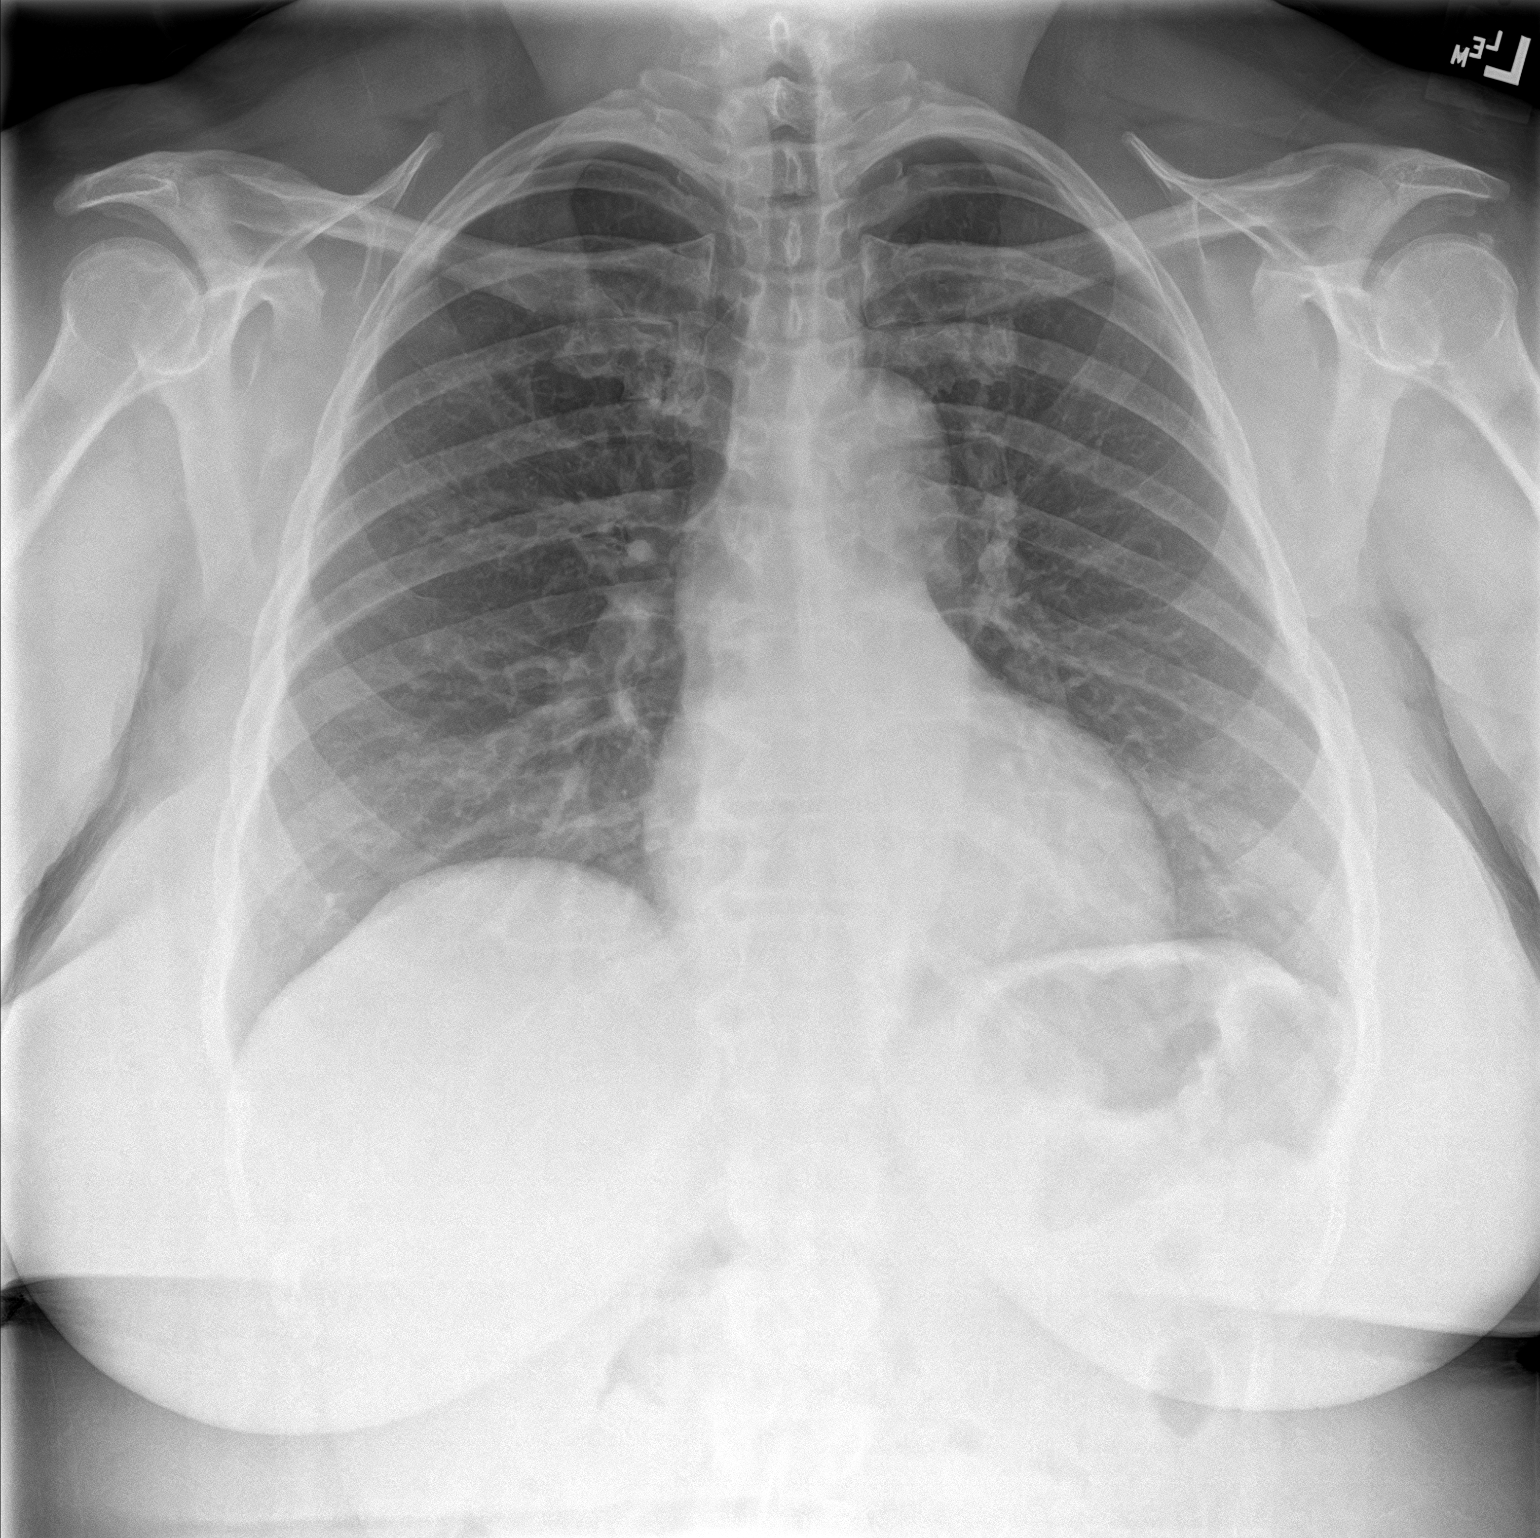
[im 2/2]
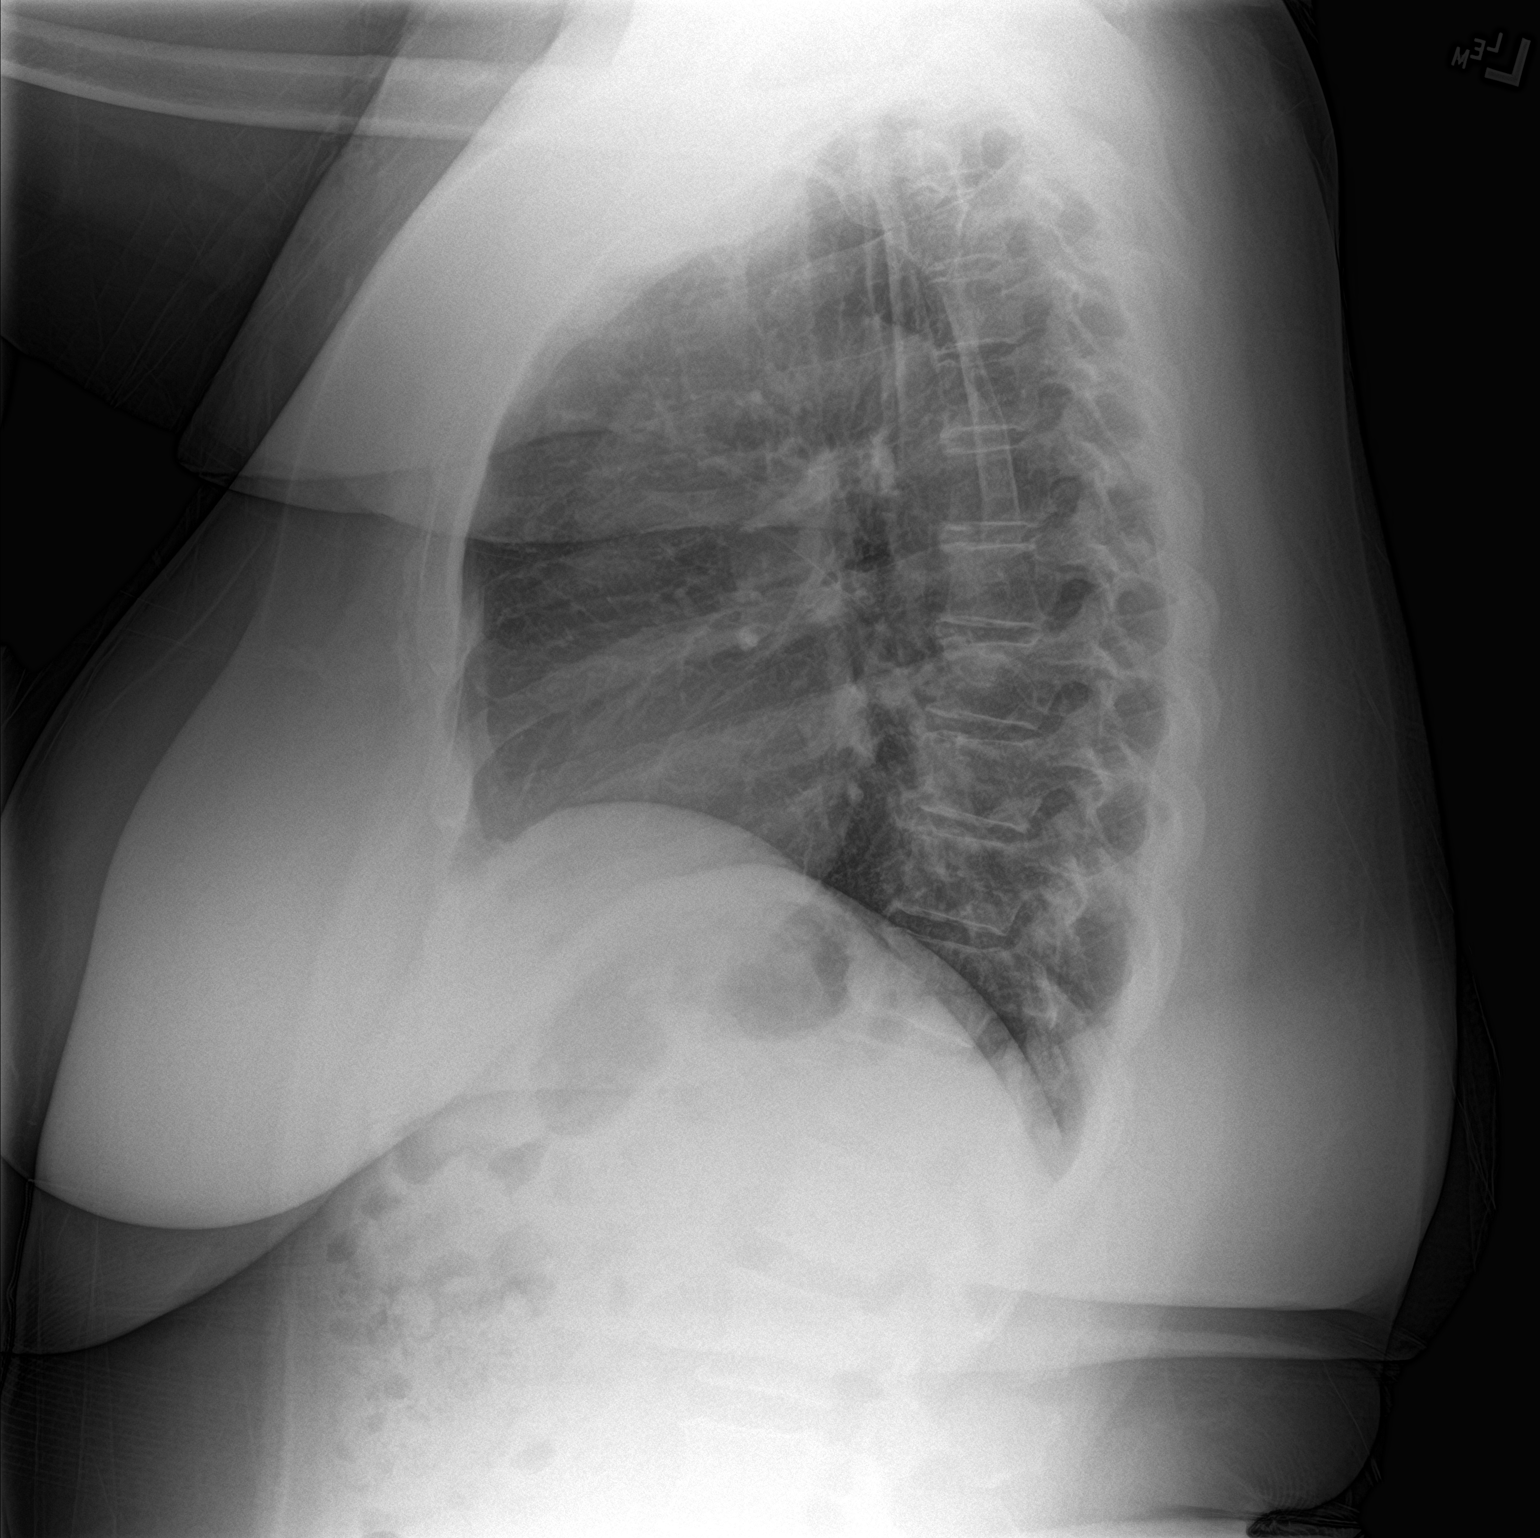

[2 of 2 positions shown; findings below may reference images not displayed]

FINDINGS: The heart size and mediastinal contours are within normal limits.
Both lungs are clear. The visualized skeletal structures are
unremarkable.
IMPRESSION: No active cardiopulmonary disease.

## 2021-11-01 ENCOUNTER — Ambulatory Visit: Payer: BC Managed Care – PPO | Admitting: Skilled Nursing Facility1

## 2021-12-15 ENCOUNTER — Encounter: Payer: BC Managed Care – PPO | Attending: Surgery | Admitting: Skilled Nursing Facility1

## 2021-12-15 DIAGNOSIS — E669 Obesity, unspecified: Secondary | ICD-10-CM | POA: Diagnosis not present

## 2021-12-15 NOTE — Progress Notes (Signed)
Bariatric Nutrition Follow-Up Visit Medical Nutrition Therapy    NUTRITION ASSESSMENT   Surgery date: 10/20/2020 Surgery type: sleeve Start weight at [facility]: 273 Weight today: 189.1 pounds   Body Composition Scale 11/03/2020 12/22/2020 03/23/2021 08/03/2021 12/15/2021  Current Body Weight 254.8 233.6 209.3 189.1 172.1  Total Body Fat % 47.1 45.2 42.4 39.5 36.8  Visceral Fat 19 17 14 12 10   Fat-Free Mass % 52.8 54.7 57.5 60.4 63.1   Total Body Water % 40.9 41.8 43.2 44.7 46.0  Muscle-Mass lbs 30.1 29.8 29.6 29.4 29.0  BMI 44.8 41 36.7 33.1 30.1  Body Fat Displacement              Torso  lbs 74.4 65.4 54.9 46.1 39.1         Left Leg  lbs 14.8 13 10.9 9.2 7.8         Right Leg  lbs 14.8 13 10.9 9.2 7.8         Left Arm  lbs 7.4 6.5 5.4 4.6 3.9         Right Arm   lbs 7.4 6.5 5.4 4.6 3.9    Clinical  Medical hx: N/A Medications: vitamin E, apple cider vinegar, multi and calcium, supplement for her hot flashes, biotin   Labs:  Notable signs/symptoms: night sweats (menapause) Any previous deficiencies? Vitamin D   Lifestyle & Dietary Hx   Pt states she has a great support system.   Pt states eating in general has changed for her: making healthy decisions without any thought.  Pt states she grazes over her dinner finishing the appropriate portions.  Pt states she does not wake and eat now in the middle of the night; Stating she keeps water by the bed as a strategy. Pt states she has 2 or 3 alcoholic drinks a day over the weekend: Dietitian discussed this.  Pt states on a scale of 1 to 10, her diet is a 7. Pt states she could do better with lunch. Pt states she could drink more water. Pt states she has BM every other day. Pt states she has not started eating bread. Pt states her first goal is 170 and would like to get to 150 pounds.  Estimated daily fluid intake: 40 oz Estimated daily protein intake: 70+ g Supplements: multi and calcium  Current average weekly physical activity:  5-6 days a week: 2 days at home weighted hoola hoop. Walk 4-5 days a week. Swim as the weather gets better. Pilate's some.  24-Hr Dietary Recall First Meal: fruit and 2 boiled eggs, premier protein shake Snack:  Second Meal: skipped or tuna packs in salad (eating half the tuna and half of salad)  Snack: cheese + fruit (watermelon) 3 days a week Third Meal: salmon + broccoli + salad Snack: popcorn or pork rinds or watermelon Snack:  Beverages: water, decaf coffee  Post-Op Goals/ Signs/ Symptoms Using straws: no Drinking while eating: no Chewing/swallowing difficulties: no Changes in vision: no Changes to mood/headaches: no Hair loss/changes to skin/nails: no Difficulty focusing/concentrating: no Sweating: no Dizziness/lightheadedness: no Palpitations: no  Carbonated/caffeinated beverages: no N/V/D/C/Gas: no; mirilax every 2 days  Abdominal pain: no Dumping syndrome: no    NUTRITION DIAGNOSIS  Overweight/obesity ([place]-3.3) related to past poor dietary habits and physical inactivity as evidenced by completed bariatric surgery and following dietary guidelines for continued weight loss and healthy nutrition status.     NUTRITION INTERVENTION Nutrition counseling (C-1) and education (E-2) to facilitate bariatric surgery goals, including: The importance  of consuming adequate calories as well as certain nutrients daily due to the body's need for essential vitamins, minerals, and fats The importance of daily physical activity and to reach a goal of at least 150 minutes of moderate to vigorous physical activity weekly (or as directed by their physician) due to benefits such as increased musculature and improved lab values The importance of intuitive eating specifically learning hunger-satiety cues and understanding the importance of learning a new body: The importance of mindful eating to avoid grazing behaviors  Importance of vegetables To have an overall healthy diet, adult men and women  are recommended to consume anywhere from 2-3 cups of vegetables daily. Vegetables provide a wide range of vitamins and minerals such as vitamin A, vitamin C, potassium, and folic acid. According to [employer], including fruit and vegetables daily may reduce the risk of cardiovascular disease, certain cancers, and other non-communicable diseases. Why you need complex carbohydrates: Whole grains and other complex carbohydrates are required to have a healthy diet. Whole grains provide fiber which can help with blood glucose levels and help keep you satiated. Fruits and starchy vegetables provide essential vitamins and minerals required for immune function, eyesight support, brain support, bone density, wound healing and many other functions within the body. According to the current evidenced based 2020-2025 Dietary Guidelines for Americans, complex carbohydrates are part of a healthy eating pattern which is associated with a decreased risk for type 2 diabetes, cancers, and cardiovascular disease.  Encouraged patient to honor their body's internal hunger and fullness cues.  Throughout the day, check in mentally and rate hunger. Stop eating when satisfied not full regardless of how much food is left on the plate.  Get more if still hungry 20-30 minutes later.  The key is to honor satisfaction so throughout the meal, rate fullness factor and stop when comfortably satisfied not physically full. The key is to honor hunger and fullness without any feelings of guilt or shame.  Pay attention to what the internal cues are, rather than any external factors. This will enhance the confidence you have in listening to your own body and following those internal cues enabling you to increase how often you eat when you are hungry not out of appetite and stop when you are satisfied not full.  Encouraged pt to continue to eat balanced meals inclusive of non starchy vegetables 2 times a day 7 days a week Encouraged  pt to choose lean protein sources: limiting beef, pork, sausage, hotdogs, and lunch meat Encourage pt to choose healthy fats such as plant based limiting animal fats Encouraged pt to continue to drink a minium 64 fluid ounces with half being plain water to satisfy proper hydration    Goals: -continue to try to have something for lunch -make your year appointment with your surgeon   Handouts Provided Include    Learning Style & Readiness for Change Teaching method utilized: Visual & Auditory  Demonstrated degree of understanding via: Teach Back  Readiness Level: Action Barriers to learning/adherence to lifestyle change: none identified   RD's Notes for Next Visit Assess adherence to pt chosen goals   MONITORING & EVALUATION Dietary intake, weekly physical activity, body weight Next Steps Patient is to follow-up as needed please reach out if any future questions or concerns call or email

## 2022-04-12 DIAGNOSIS — M7742 Metatarsalgia, left foot: Secondary | ICD-10-CM | POA: Insufficient documentation

## 2022-04-12 DIAGNOSIS — M7752 Other enthesopathy of left foot: Secondary | ICD-10-CM | POA: Insufficient documentation

## 2022-04-12 DIAGNOSIS — M2142 Flat foot [pes planus] (acquired), left foot: Secondary | ICD-10-CM | POA: Insufficient documentation

## 2022-04-12 DIAGNOSIS — M779 Enthesopathy, unspecified: Secondary | ICD-10-CM | POA: Diagnosis not present

## 2022-04-12 DIAGNOSIS — M79672 Pain in left foot: Secondary | ICD-10-CM | POA: Diagnosis not present

## 2022-05-06 ENCOUNTER — Encounter (HOSPITAL_COMMUNITY): Payer: Self-pay | Admitting: *Deleted

## 2022-05-10 ENCOUNTER — Encounter: Payer: Self-pay | Admitting: Nurse Practitioner

## 2022-05-10 ENCOUNTER — Ambulatory Visit: Payer: BC Managed Care – PPO | Admitting: Nurse Practitioner

## 2022-05-10 VITALS — BP 128/76 | HR 64 | Temp 96.4°F | Resp 14 | Ht 63.0 in | Wt 182.2 lb

## 2022-05-10 DIAGNOSIS — R7989 Other specified abnormal findings of blood chemistry: Secondary | ICD-10-CM

## 2022-05-10 DIAGNOSIS — E6609 Other obesity due to excess calories: Secondary | ICD-10-CM

## 2022-05-10 DIAGNOSIS — R591 Generalized enlarged lymph nodes: Secondary | ICD-10-CM

## 2022-05-10 DIAGNOSIS — N951 Menopausal and female climacteric states: Secondary | ICD-10-CM | POA: Diagnosis not present

## 2022-05-10 DIAGNOSIS — E782 Mixed hyperlipidemia: Secondary | ICD-10-CM

## 2022-05-10 DIAGNOSIS — Z23 Encounter for immunization: Secondary | ICD-10-CM | POA: Diagnosis not present

## 2022-05-10 DIAGNOSIS — Z6832 Body mass index (BMI) 32.0-32.9, adult: Secondary | ICD-10-CM

## 2022-05-10 MED ORDER — VENLAFAXINE HCL ER 75 MG PO CP24: 75.0000 mg | ORAL_CAPSULE | Freq: Every day | ORAL | 0 refills | Status: AC

## 2022-05-10 NOTE — Assessment & Plan Note (Signed)
Has been approximately 2 years since patient has had menstrual cycle.  Has tried black cohosh without great result.  Was doing well with venlafaxine 37 and half milligrams daily.  Except for the last 3 months.  Will increase venlafaxine from 37 and half milligrams to 75 mg daily.

## 2022-05-10 NOTE — Progress Notes (Signed)
Established Patient Office Visit  Subjective   Patient ID: Renee Daniels, female    DOB: 09/07/1965  Age: 56 y.o. MRN: [id number]  Chief Complaint  Patient presents with   Transfer of Care   Hot Flashes    Effexor has been helping at first but lately now hot flashes increased even more, wonders if needs a stronger dose or another medication   sprain of foot    Left foot/ankle, about 1 month ago. Went to [facility] in [place] and wore a boot for about 10 days and took diclofenac medication. Symptoms are much better, once in a while gets a little pull/burning sensation in the left ankle.    HPI  Hot flashes: was mentioned in last office visit with Dr. Einar Pheasant. She has tried otc black cohosh. Has been currenlty using effexor that was helping at first. States approx 3 months ago she straaed having increased hot flashes. States that she was having then a cuple during the day and had then at night. States now at night she is doing well and having them through the day.  Papsmear: thinks approx in 2021    Ankle Sprain: happened approx 1 month ago. States that she was seen by [facility] and was placed ina boot for approx 10 days  States that she started doing water aerobics. States that she was landing on the balls of the feet. Diclofenac and a boot. States that she did order a more supportive water shoe      Review of Systems  Constitutional:  Negative for chills and fever.  Respiratory:  Negative for shortness of breath.   Cardiovascular:  Negative for chest pain.  Gastrointestinal:  Negative for abdominal pain, blood in stool, constipation, diarrhea, nausea and vomiting.       BM daily   Genitourinary:  Negative for dysuria and hematuria.  Neurological:  Negative for tingling and headaches.  Psychiatric/Behavioral:  Negative for hallucinations and suicidal ideas.       Objective:     BP 128/76   Pulse 64   Temp (!) 96.4 F (35.8 C) (Temporal)   Resp 14   Ht 5\' 3"   (1.6 m)   Wt 182 lb 4 oz (82.7 kg)   LMP 09/08/2020   BMI 32.28 kg/m  BP Readings from Last 3 Encounters:  05/10/22 128/76  04/01/21 114/82  10/21/20 102/71   Wt Readings from Last 3 Encounters:  05/10/22 182 lb 4 oz (82.7 kg)  08/03/21 189 lb 1.6 oz (85.8 kg)  04/01/21 207 lb 12 oz (94.2 kg)      Physical Exam Vitals and nursing note reviewed.  Constitutional:      Appearance: Normal appearance.  HENT:     Right Ear: Tympanic membrane, ear canal and external ear normal.     Left Ear: Tympanic membrane, ear canal and external ear normal.     Mouth/Throat:     Mouth: Mucous membranes are moist.     Pharynx: Oropharynx is clear.  Eyes:     Extraocular Movements: Extraocular movements intact.     Pupils: Pupils are equal, round, and reactive to light.  Cardiovascular:     Rate and Rhythm: Normal rate and regular rhythm.     Pulses: Normal pulses.     Heart sounds: Normal heart sounds.  Pulmonary:     Effort: Pulmonary effort is normal.     Breath sounds: Normal breath sounds.  Musculoskeletal:     Right lower leg: No edema.  Left lower leg: No edema.  Lymphadenopathy:     Cervical: Cervical adenopathy present.  Skin:    General: Skin is warm.  Neurological:     General: No focal deficit present.     Mental Status: She is alert.     Deep Tendon Reflexes:     Reflex Scores:      Bicep reflexes are 2+ on the right side and 2+ on the left side.      Patellar reflexes are 2+ on the right side and 2+ on the left side.    Comments: Bilateral upper and lower extremity strength 5/5.  Psychiatric:        Mood and Affect: Mood normal.        Behavior: Behavior normal.        Thought Content: Thought content normal.        Judgment: Judgment normal.      No results found for any visits on 05/10/22.    The 10-year ASCVD risk score (Arnett DK, et al., 2019) is: 2.2%    Assessment & Plan:   Problem List Items Addressed This Visit       Cardiovascular and  Mediastinum   Hot flashes due to menopause    Has been approximately 2 years since patient has had menstrual cycle.  Has tried black cohosh without great result.  Was doing well with venlafaxine 37 and half milligrams daily.  Except for the last 3 months.  Will increase venlafaxine from 37 and half milligrams to 75 mg daily.      Relevant Medications   venlafaxine XR (EFFEXOR XR) 75 MG 24 hr capsule     Immune and Lymphatic   Lymphadenopathy    Incidental finding bilaterally anterior cervical chain.  Patient to follow-up in 1 month if still present ultrasound of neck and thyroid.        Other   Hyperlipidemia    Patient has underwent gastric sleeve approximately year ago.  We will recheck lipids at physical in 1 month      Elevated LFTs    Noted in the last year's physical blood work.  We will repeat at next physical to discuss this with patient office today.      Class 1 obesity due to excess calories without serious comorbidity with body mass index (BMI) of 32.0 to 32.9 in adult    Status post gastric sleeve surgery.  Patient has lost weight since January of this year.  Continue working on healthy lifestyle modifications.      Other Visit Diagnoses     Need for influenza vaccination    -  Primary   Relevant Orders   Flu Vaccine QUAD 6+ mos PF IM (Fluarix Quad PF)       Return in about 4 weeks (around 06/07/2022) for CPE and FASTING labs.    Romilda Garret, NP

## 2022-05-10 NOTE — Assessment & Plan Note (Signed)
Incidental finding bilaterally anterior cervical chain.  Patient to follow-up in 1 month if still present ultrasound of neck and thyroid.

## 2022-05-10 NOTE — Patient Instructions (Signed)
Nice to see you today I want to see you in a month for your physical with labs. I want this to be a FASTING appointment  I increased the hot flash medication to 75mg  and sent a new prescription in

## 2022-05-10 NOTE — Assessment & Plan Note (Signed)
Noted in the last year's physical blood work.  We will repeat at next physical to discuss this with patient office today.

## 2022-05-10 NOTE — Assessment & Plan Note (Signed)
Patient has underwent gastric sleeve approximately year ago.  We will recheck lipids at physical in 1 month

## 2022-05-10 NOTE — Assessment & Plan Note (Signed)
Status post gastric sleeve surgery.  Patient has lost weight since January of this year.  Continue working on healthy lifestyle modifications.

## 2022-06-20 ENCOUNTER — Ambulatory Visit (INDEPENDENT_AMBULATORY_CARE_PROVIDER_SITE_OTHER): Payer: BC Managed Care – PPO | Admitting: Nurse Practitioner

## 2022-06-20 ENCOUNTER — Encounter: Payer: Self-pay | Admitting: Nurse Practitioner

## 2022-06-20 VITALS — BP 122/82 | HR 54 | Temp 97.3°F | Ht 63.0 in | Wt 183.0 lb

## 2022-06-20 DIAGNOSIS — R7989 Other specified abnormal findings of blood chemistry: Secondary | ICD-10-CM | POA: Diagnosis not present

## 2022-06-20 DIAGNOSIS — R591 Generalized enlarged lymph nodes: Secondary | ICD-10-CM

## 2022-06-20 DIAGNOSIS — E6609 Other obesity due to excess calories: Secondary | ICD-10-CM | POA: Diagnosis not present

## 2022-06-20 DIAGNOSIS — E785 Hyperlipidemia, unspecified: Secondary | ICD-10-CM | POA: Diagnosis not present

## 2022-06-20 DIAGNOSIS — Z23 Encounter for immunization: Secondary | ICD-10-CM

## 2022-06-20 DIAGNOSIS — Z6832 Body mass index (BMI) 32.0-32.9, adult: Secondary | ICD-10-CM | POA: Diagnosis not present

## 2022-06-20 DIAGNOSIS — Z0000 Encounter for general adult medical examination without abnormal findings: Secondary | ICD-10-CM

## 2022-06-20 DIAGNOSIS — Z9884 Bariatric surgery status: Secondary | ICD-10-CM

## 2022-06-20 DIAGNOSIS — N951 Menopausal and female climacteric states: Secondary | ICD-10-CM | POA: Diagnosis not present

## 2022-06-20 NOTE — Patient Instructions (Signed)
Nice to see you today I will be in touch with the labs once I have them Follow up in 1 year with me sooner if you need me

## 2022-06-20 NOTE — Assessment & Plan Note (Signed)
Discussed age-appropriate immunizations and screening exams.  Did update second shingles vaccine in office today.  Patient did have Pap smear with [facility].  Did recommend repeat patient states she is okay waiting until next year when her Pap smear is due again.  Mammogram up-to-date.  Patient was given information at discharge in regards to preventative healthcare maintenance and anticipatory guidance with her age range.

## 2022-06-20 NOTE — Assessment & Plan Note (Signed)
Check B12 and vitamin D today.  Pending labs.

## 2022-06-20 NOTE — Assessment & Plan Note (Signed)
Continue working on healthy lifestyle modifications inclusive of diet and exercise. 

## 2022-06-20 NOTE — Progress Notes (Signed)
Established Patient Office Visit  Subjective   Patient ID: Renee Daniels, female    DOB: Sep 14, 1965  Age: 56 y.o. MRN: [id number]  Chief Complaint  Patient presents with   Annual Exam    fasting    HPI  for complete physical and follow up of chronic conditions.   Hot flashes: improved since last off visit. Has improved at night crease of venlafaxine from 37 and half milligrams to 75 mg.  Lymphadenopathy: Incidental finding on last time patient was in office exam.  Will recheck today no symptoms  Immunizations: -Tetanus:2018 -Influenza:05/10/2022 -Shingles: 04/28/2021 -Pneumonia: too young  -HPV: aged out  Diet: Fair diet. States taht she tries to eat 3 meals a day. Normally gets 2 for sure. Some snacking at night before bed. Coffee water. Exercise: No regular exercise. States that 4 days a week she will do cardio for approx 30 mins and weithg lifting 20-25 mins  Eye exam: Completes annually. Contact and glasses. Caryn Section seen this year  Dental exam: Completes semi-annually   Pap Smear: Completed in 2021 with ASCUS, negative HPV Mammogram: Completed in 08/06/2021  Colonoscopy: Completed in 05/25/2020, recall 10 years 2031 Lung Cancer Screening: N/A Dexa: N/A  Sleep: states that she retired in [employer], [employer]. States staying up later and sleeping some later. Will go to bed around midnight and get up aroun 830. Feels rested. No napping. Not consistent snoring     Review of Systems  Constitutional:  Negative for chills and fever.  Respiratory:  Negative for shortness of breath.   Cardiovascular:  Negative for chest pain and leg swelling.  Gastrointestinal:  Negative for abdominal pain, blood in stool, constipation, diarrhea, nausea and vomiting.       BM every other day   Genitourinary:  Negative for dysuria and hematuria.  Neurological:  Negative for tingling and headaches.  Psychiatric/Behavioral:  Negative for hallucinations and suicidal ideas.       Objective:     BP  122/82   Pulse (!) 54   Temp (!) 97.3 F (36.3 C) (Temporal)   Ht 5\' 3"  (1.6 m)   Wt 183 lb (83 kg)   LMP 09/08/2020   BMI 32.42 kg/m  BP Readings from Last 3 Encounters:  06/20/22 122/82  05/10/22 128/76  04/01/21 114/82   Wt Readings from Last 3 Encounters:  06/20/22 183 lb (83 kg)  05/10/22 182 lb 4 oz (82.7 kg)  08/03/21 189 lb 1.6 oz (85.8 kg)      Physical Exam Vitals and nursing note reviewed.  Constitutional:      Appearance: Normal appearance.  HENT:     Right Ear: Tympanic membrane, ear canal and external ear normal.     Left Ear: Tympanic membrane, ear canal and external ear normal.     Mouth/Throat:     Mouth: Mucous membranes are moist.     Pharynx: Oropharynx is clear.  Eyes:     Extraocular Movements: Extraocular movements intact.     Pupils: Pupils are equal, round, and reactive to light.     Comments: Wears contacts  Cardiovascular:     Rate and Rhythm: Normal rate and regular rhythm.     Pulses: Normal pulses.     Heart sounds: Normal heart sounds.  Pulmonary:     Effort: Pulmonary effort is normal.     Breath sounds: Normal breath sounds.  Abdominal:     General: Bowel sounds are normal. There is no distension.     Palpations: There is  no mass.     Tenderness: There is no abdominal tenderness.     Hernia: No hernia is present.  Musculoskeletal:        General: Normal range of motion.     Right lower leg: No edema.     Left lower leg: No edema.  Lymphadenopathy:     Cervical: No cervical adenopathy.  Skin:    General: Skin is warm.  Neurological:     General: No focal deficit present.     Mental Status: She is alert.     Deep Tendon Reflexes:     Reflex Scores:      Bicep reflexes are 2+ on the right side and 2+ on the left side.      Patellar reflexes are 2+ on the right side and 2+ on the left side.    Comments: Bilateral upper and lower extremity strength 5/5  Psychiatric:        Mood and Affect: Mood normal.        Behavior:  Behavior normal.        Thought Content: Thought content normal.        Judgment: Judgment normal.      No results found for any visits on 06/20/22.    The 10-year ASCVD risk score (Arnett DK, et al., 2019) is: 2%    Assessment & Plan:   Problem List Items Addressed This Visit       Cardiovascular and Mediastinum   Hot flashes due to menopause    Symptoms are improving with the titration of venlafaxine from 37 and half milligrams 75 mg.  Continue medication as prescribed        Immune and Lymphatic   Lymphadenopathy    No palpable lymphadenopathy on exam.  Resolved clinically        Other   Hyperlipidemia    Pending labs today.  Patient is exercising and has received gastric surgery with weight loss.      Relevant Orders   Lipid panel   Elevated LFTs    Pending labs.      Relevant Orders   Comprehensive metabolic panel   Class 1 obesity due to excess calories without serious comorbidity with body mass index (BMI) of 32.0 to 32.9 in adult    Continue working on healthy lifestyle modifications inclusive of diet and exercise.      Relevant Orders   Hemoglobin A1c   Lipid panel   Preventative health care - Primary    Discussed age-appropriate immunizations and screening exams.  Did update second shingles vaccine in office today.  Patient did have Pap smear with ASCUS.  Did recommend repeat patient states she is okay waiting until next year when her Pap smear is due again.  Mammogram up-to-date.  Patient was given information at discharge in regards to preventative healthcare maintenance and anticipatory guidance with her age range.      Relevant Orders   CBC   Comprehensive metabolic panel   Hemoglobin A1c   Lipid panel   TSH   Vitamin B12   VITAMIN D 25 Hydroxy (Vit-D Deficiency, Fractures)   History of gastric bypass    Check B12 and vitamin D today.  Pending labs.      Relevant Orders   Vitamin B12   VITAMIN D 25 Hydroxy (Vit-D Deficiency,  Fractures)   Other Visit Diagnoses     Need for shingles vaccine       Relevant Orders   Zoster Recombinant (Shingrix ) (  Completed)       Return in about 1 year (around 06/21/2023) for CPE and Labs.    Audria Nine, NP

## 2022-06-20 NOTE — Assessment & Plan Note (Signed)
Pending labs today.  Patient is exercising and has received gastric surgery with weight loss.

## 2022-06-20 NOTE — Assessment & Plan Note (Signed)
No palpable lymphadenopathy on exam.  Resolved clinically

## 2022-06-20 NOTE — Assessment & Plan Note (Signed)
Pending labs

## 2022-06-20 NOTE — Assessment & Plan Note (Signed)
Symptoms are improving with the titration of venlafaxine from 37 and half milligrams 75 mg.  Continue medication as prescribed

## 2022-06-21 LAB — CBC
Hematocrit: 38.5 % (ref 34.0–46.6)
Hemoglobin: 12.7 g/dL (ref 11.1–15.9)
MCH: 29.5 pg (ref 26.6–33.0)
MCHC: 33 g/dL (ref 31.5–35.7)
MCV: 89 fL (ref 79–97)
Platelets: 212 10*3/uL (ref 150–450)
RBC: 4.31 x10E6/uL (ref 3.77–5.28)
RDW: 11.9 % (ref 11.7–15.4)
WBC: 9.3 10*3/uL (ref 3.4–10.8)

## 2022-06-21 LAB — COMPREHENSIVE METABOLIC PANEL
ALT: 117 IU/L — ABNORMAL HIGH (ref 0–32)
AST: 90 IU/L — ABNORMAL HIGH (ref 0–40)
Albumin/Globulin Ratio: 1.5 (ref 1.2–2.2)
Albumin: 4.5 g/dL (ref 3.8–4.9)
Alkaline Phosphatase: 155 IU/L — ABNORMAL HIGH (ref 44–121)
BUN/Creatinine Ratio: 24 — ABNORMAL HIGH (ref 9–23)
BUN: 20 mg/dL (ref 6–24)
Bilirubin Total: 0.7 mg/dL (ref 0.0–1.2)
CO2: 23 mmol/L (ref 20–29)
Calcium: 10 mg/dL (ref 8.7–10.2)
Chloride: 99 mmol/L (ref 96–106)
Creatinine, Ser: 0.82 mg/dL (ref 0.57–1.00)
Globulin, Total: 3.1 g/dL (ref 1.5–4.5)
Glucose: 75 mg/dL (ref 70–99)
Potassium: 4.4 mmol/L (ref 3.5–5.2)
Sodium: 140 mmol/L (ref 134–144)
Total Protein: 7.6 g/dL (ref 6.0–8.5)
eGFR: 84 mL/min/{1.73_m2} (ref >59)

## 2022-06-21 LAB — TSH: TSH: 1.04 u[IU]/mL (ref 0.450–4.500)

## 2022-06-21 LAB — VITAMIN B12: Vitamin B-12: >2000 pg/mL — ABNORMAL HIGH (ref 232–1245)

## 2022-06-21 LAB — HEMOGLOBIN A1C
Est. average glucose Bld gHb Est-mCnc: 82 mg/dL
Hgb A1c MFr Bld: 4.5 % — ABNORMAL LOW (ref 4.8–5.6)

## 2022-06-21 LAB — LIPID PANEL
Chol/HDL Ratio: 2.4 ratio (ref 0.0–4.4)
Cholesterol, Total: 199 mg/dL (ref 100–199)
HDL: 84 mg/dL (ref >39)
LDL Chol Calc (NIH): 101 mg/dL — ABNORMAL HIGH (ref 0–99)
Triglycerides: 79 mg/dL (ref 0–149)
VLDL Cholesterol Cal: 14 mg/dL (ref 5–40)

## 2022-06-21 LAB — VITAMIN D 25 HYDROXY (VIT D DEFICIENCY, FRACTURES): Vit D, 25-Hydroxy: 36.1 ng/mL (ref 30.0–100.0)

## 2022-06-24 ENCOUNTER — Telehealth: Payer: Self-pay | Admitting: Nurse Practitioner

## 2022-06-24 DIAGNOSIS — R7989 Other specified abnormal findings of blood chemistry: Secondary | ICD-10-CM

## 2022-06-24 NOTE — Telephone Encounter (Signed)
-----   Message from Lonia Blood, [place] sent at 06/24/2022 12:39 PM EST ----- Called and spoke with patient, she has viewed the MyChart message regarding her results.  She has not had a [place] of her liver in the past. She prefers to go to [place].

## 2022-06-24 NOTE — Telephone Encounter (Signed)
Patient notified as instructed by telephone and verbalized understanding. Telephone number was given to patient for her to call and schedule.

## 2022-06-24 NOTE — Telephone Encounter (Signed)
Order for [place] placed for [facility]. Please give patient information to call and get scheduled

## 2022-06-27 ENCOUNTER — Ambulatory Visit
Admission: RE | Admit: 2022-06-27 | Discharge: 2022-06-27 | Disposition: A | Discharge status: Home / Self Care | Payer: BC Managed Care – PPO | Source: Ambulatory Visit | Attending: Nurse Practitioner | Admitting: Nurse Practitioner

## 2022-06-27 DIAGNOSIS — R7989 Other specified abnormal findings of blood chemistry: Secondary | ICD-10-CM | POA: Insufficient documentation

## 2022-06-27 DIAGNOSIS — R945 Abnormal results of liver function studies: Secondary | ICD-10-CM | POA: Diagnosis not present

## 2022-06-30 ENCOUNTER — Telehealth: Payer: Self-pay | Admitting: Nurse Practitioner

## 2022-06-30 DIAGNOSIS — R7989 Other specified abnormal findings of blood chemistry: Secondary | ICD-10-CM

## 2022-06-30 NOTE — Telephone Encounter (Signed)
Referral placed.

## 2022-06-30 NOTE — Telephone Encounter (Signed)
-----   Message from Jenate [place], [place] sent at 06/30/2022 12:14 PM EST ----- Spoke to patient as she has seen her results. Pt verbalized understanding. Pt stated that she would prefer here in [place]?

## 2022-08-04 ENCOUNTER — Other Ambulatory Visit: Payer: Self-pay | Admitting: Nurse Practitioner

## 2022-08-04 DIAGNOSIS — Z1231 Encounter for screening mammogram for malignant neoplasm of breast: Secondary | ICD-10-CM

## 2022-08-17 ENCOUNTER — Ambulatory Visit
Admission: RE | Admit: 2022-08-17 | Discharge: 2022-08-17 | Disposition: A | Discharge status: Home / Self Care | Payer: Managed Care, Other (non HMO) | Source: Ambulatory Visit

## 2022-08-17 DIAGNOSIS — Z1231 Encounter for screening mammogram for malignant neoplasm of breast: Secondary | ICD-10-CM

## 2022-11-14 ENCOUNTER — Encounter: Payer: Self-pay | Admitting: Gastroenterology

## 2022-11-14 ENCOUNTER — Ambulatory Visit: Payer: Managed Care, Other (non HMO) | Admitting: Gastroenterology

## 2022-11-14 VITALS — BP 114/63 | HR 60 | Temp 98.3°F | Ht 63.0 in | Wt 185.6 lb

## 2022-11-14 DIAGNOSIS — R7989 Other specified abnormal findings of blood chemistry: Secondary | ICD-10-CM

## 2022-11-14 NOTE — Progress Notes (Signed)
Wyline Mood MD, MRCP(U.K) [address]  [address]  [place], [place] [address]  Main: [phone]  Fax: [phone]   Gastroenterology Consultation  Referring Provider:     Eden Emms, NP Primary Care Physician:  Eden Emms, NP Primary Gastroenterologist:  Dr. Wyline Mood  Reason for Consultation:     abnormal LFT's        HPI:   Renee Daniels is a 57 y.o. y/o female referred for consultation & management  by Eden Emms, NP.     First noted abnormality in LFT's in > 2 years noted particularly after she had weight loss surgery i.e. bariatric surgery Alcohol use glass of wine every other day never more  Drug use none Over the counter herbal supplements none New medications multivitamin post bariatric surgery Abdominal pain none Tattoos none Military service none Incarceration none History of travel none Family history of liver disease none Recent change in weight significant weight loss after bariatric surgery  RUQ USG 06/2022: normal       Latest Ref Rng & Units 06/20/2022    3:15 PM 04/01/2021    2:43 PM 10/21/2020    4:14 AM  Hepatic Function  Total Protein 6.0 - 8.5 g/dL 7.6  6.8  6.7   Albumin 3.8 - 4.9 g/dL 4.5  3.7  3.3   AST 0 - 40 IU/L 90  59  23   ALT 0 - 32 IU/L 117  54  31   Alk Phosphatase 44 - 121 IU/L 155  216  70   Total Bilirubin 0.0 - 1.2 mg/dL 0.7  0.8  0.7     No past medical history on file.  Past Surgical History:  Procedure Laterality Date   CESAREAN SECTION     x 3   COLONOSCOPY WITH PROPOFOL N/A 05/25/2020   Procedure: COLONOSCOPY WITH PROPOFOL;  Surgeon: Wyline Mood, MD;  Location[facility] ENDOSCOPY;  Service: Gastroenterology;  Laterality: N/A;   LAPAROSCOPIC GASTRIC SLEEVE RESECTION N/A 10/20/2020   Procedure: LAPAROSCOPIC GASTRIC SLEEVE RESECTION;  Surgeon: Berna Bue, MD;  Location: [facility] ORS;  Service: General;  Laterality: N/A;   TUBAL LIGATION     UPPER GI ENDOSCOPY N/A 10/20/2020   Procedure: UPPER GI ENDOSCOPY;   Surgeon: Berna Bue, MD;  Location: [facility] ORS;  Service: General;  Laterality: N/A;    Prior to Admission medications   Medication Sig Start Date End Date Taking? Authorizing Provider  OVER THE COUNTER MEDICATION Bariatric Vitamin with Iron-1 daily    [provider]  venlafaxine XR (EFFEXOR XR) 75 MG 24 hr capsule Take 1 capsule (75 mg total) by mouth daily with breakfast. 05/10/22   Eden Emms, NP    Family History  Problem Relation Age of Onset   Cancer Mother        leukemia or lymphoma   Hypertension Father    Healthy Brother    Healthy Daughter    Healthy Son    Pancreatic cancer Maternal Grandmother    Heart disease Maternal Grandfather    Heart attack Maternal Grandfather 58   Heart disease Paternal Grandmother    Heart attack Paternal Grandmother 21   Healthy Daughter    Lung cancer Maternal Aunt      Social History   Tobacco Use   Smoking status: Former    Packs/day: 0.25    Years: 17.00    Additional pack years: 0.00    Total pack years: 4.25  Types: Cigarettes    Quit date: 2011    Years since quitting: 13.3   Smokeless tobacco: Never  Vaping Use   Vaping Use: Never used  Substance Use Topics   Alcohol use: Yes    Alcohol/week: 3.0 standard drinks of alcohol    Types: 3 Glasses of wine per week   Drug use: Never    Allergies as of 11/14/2022   (No Known Allergies)    Review of Systems:    All systems reviewed and negative except where noted in HPI.   Physical Exam:  BP 114/63   Pulse 60   Temp 98.3 F (36.8 C) (Oral)   Ht 5\' 3"  (1.6 m)   Wt 185 lb 9.6 oz (84.2 kg)   LMP 09/08/2020   BMI 32.88 kg/m  Patient's last menstrual period was 09/08/2020. Psych:  Alert and cooperative. Normal mood and affect. General:   Alert,  Well-developed, well-nourished, pleasant and cooperative in NAD Head:  Normocephalic and atraumatic. Eyes:  Sclera clear, no icterus.   Conjunctiva pink. Ears:  Normal auditory acuity. Neck:  Supple;  no masses or thyromegaly.    Neurologic:  Alert and oriented x3;  grossly normal neurologically. Psych:  Alert and cooperative. Normal mood and affect.  Imaging Studies: No results found.  Assessment and Plan:   Renee Daniels is a 57 y.o. y/o female has been referred for  elevated liver function tests    Further labs necessary to look for viral hepatitis, autoimmune liver disease, Primary Biliary cirrhosis, celiac disease, muscle disorders,Primary Sclerosing Cholangitis, Hemachromatosis, Wilson's disease, or A-1 antitrypsin deficiency.    Follow up in 3 to 4 months with myself or PA  Dr Wyline Mood MD,MRCP(U.K)

## 2022-11-15 LAB — IRON,TIBC AND FERRITIN PANEL: UIBC: 186 ug/dL (ref 131–425)

## 2022-11-15 LAB — IMMUNOGLOBULINS A/E/G/M, SERUM
IgA/Immunoglobulin A, Serum: 431 mg/dL — ABNORMAL HIGH (ref 87–352)
IgM (Immunoglobulin M), Srm: 76 mg/dL (ref 26–217)

## 2022-11-15 LAB — HEPATITIS C ANTIBODY: Hep C Virus Ab: NONREACTIVE

## 2022-11-15 LAB — CELIAC DISEASE AB SCREEN W/RFX
Antigliadin Abs, IgA: 6 units (ref 0–19)
Transglutaminase IgA: <2 U/mL (ref 0–3)

## 2022-11-15 LAB — MITOCHONDRIAL/SMOOTH MUSCLE AB PNL: Mitochondrial Ab: <20 Units (ref 0.0–20.0)

## 2022-11-15 LAB — HEPATIC FUNCTION PANEL
AST: 81 IU/L — ABNORMAL HIGH (ref 0–40)
Albumin: 4.4 g/dL (ref 3.8–4.9)
Bilirubin Total: 0.8 mg/dL (ref 0.0–1.2)
Bilirubin, Direct: 0.17 mg/dL (ref 0.00–0.40)

## 2022-11-15 LAB — HEPATITIS B SURFACE ANTIGEN: Hepatitis B Surface Ag: NEGATIVE

## 2022-11-17 NOTE — Progress Notes (Signed)
Labs show no abnormalities : LFT's look better . Ensure patient has follow up with me to monitor them . Repeat LFT's in 8-12 weeks

## 2022-11-18 LAB — ALPHA-1-ANTITRYPSIN: A-1 Antitrypsin: 142 mg/dL (ref 101–187)

## 2022-11-18 LAB — HEPATIC FUNCTION PANEL
ALT: 81 IU/L — ABNORMAL HIGH (ref 0–32)
Alkaline Phosphatase: 113 IU/L (ref 44–121)
Total Protein: 7.5 g/dL (ref 6.0–8.5)

## 2022-11-18 LAB — HEPATITIS B SURFACE ANTIBODY,QUALITATIVE: Hep B Surface Ab, Qual: NONREACTIVE

## 2022-11-18 LAB — CERULOPLASMIN: Ceruloplasmin: 29.5 mg/dL (ref 19.0–39.0)

## 2022-11-18 LAB — HEPATITIS B CORE ANTIBODY, TOTAL: Hep B Core Total Ab: NEGATIVE

## 2022-11-18 LAB — IRON,TIBC AND FERRITIN PANEL
Ferritin: 397 ng/mL — ABNORMAL HIGH (ref 15–150)
Iron Saturation: 45 % (ref 15–55)
Iron: 153 ug/dL (ref 27–159)
Total Iron Binding Capacity: 339 ug/dL (ref 250–450)

## 2022-11-18 LAB — ANTI-MICROSOMAL ANTIBODY LIVER / KIDNEY: LKM1 Ab: 2.1 Units (ref 0.0–20.0)

## 2022-11-18 LAB — HEPATITIS B E ANTIBODY: Hep B E Ab: NONREACTIVE

## 2022-11-18 LAB — HEPATITIS B E ANTIGEN: Hep B E Ag: NEGATIVE

## 2022-11-18 LAB — ANA: Anti Nuclear Antibody (ANA): NEGATIVE

## 2022-11-18 LAB — IMMUNOGLOBULINS A/E/G/M, SERUM: IgG (Immunoglobin G), Serum: 1525 mg/dL (ref 586–1602)

## 2022-11-18 LAB — MITOCHONDRIAL/SMOOTH MUSCLE AB PNL: Smooth Muscle Ab: 17 Units (ref 0–19)

## 2022-11-18 LAB — CK: Total CK: 144 U/L (ref 32–182)

## 2022-11-18 LAB — HEPATITIS A ANTIBODY, TOTAL: hep A Total Ab: POSITIVE — AB

## 2022-11-18 LAB — HIV ANTIBODY (ROUTINE TESTING W REFLEX): HIV Screen 4th Generation wRfx: NONREACTIVE

## 2022-11-24 ENCOUNTER — Other Ambulatory Visit: Payer: Self-pay

## 2022-11-24 DIAGNOSIS — R7989 Other specified abnormal findings of blood chemistry: Secondary | ICD-10-CM

## 2022-11-24 NOTE — Addendum Note (Signed)
Addended by: Adela Ports on: 11/24/2022 02:01 PM   Modules accepted: Orders

## 2023-02-14 ENCOUNTER — Other Ambulatory Visit: Payer: Self-pay

## 2023-02-14 ENCOUNTER — Encounter: Payer: Self-pay | Admitting: Gastroenterology

## 2023-02-14 ENCOUNTER — Ambulatory Visit: Payer: Managed Care, Other (non HMO) | Admitting: Gastroenterology

## 2023-02-14 VITALS — BP 125/80 | HR 59 | Temp 98.0°F | Wt 184.0 lb

## 2023-02-14 DIAGNOSIS — Z6838 Body mass index (BMI) 38.0-38.9, adult: Secondary | ICD-10-CM | POA: Diagnosis not present

## 2023-02-14 DIAGNOSIS — K760 Fatty (change of) liver, not elsewhere classified: Secondary | ICD-10-CM

## 2023-02-14 NOTE — Patient Instructions (Signed)
Nonalcoholic Fatty Liver Disease Diet, Adult Nonalcoholic fatty liver disease is a condition that causes fat to build up in and around the liver. The disease makes it harder for the liver to work the way that it should. Eating a healthy diet of fruits, vegetables, whole grains, lean proteins, and limiting added sugar and fats can help to keep nonalcoholic fatty liver disease under control. It can also help to prevent or improve conditions that are related to the disease, such as heart disease, diabetes, high blood pressure, obesity, and high cholesterol. Along with regular exercise, this diet: Promotes weight loss. Helps to control blood sugar levels. Helps to improve the way that the body uses insulin. What are tips for following this plan? Reading food labels Always check food labels for: The amount of saturated fat in a food. You should limit how much saturated fat you eat. Saturated fat is found in foods that come from animals, including meat and dairy products such as butter, cheese, and whole milk. The amount of fiber in a food. You should choose high-fiber foods such as fruits, vegetables, and whole grains. Try to get 25-30 grams (g) of fiber a day. Added sugar. Avoid foods with a high amount of added sugar and high fructose corn syrup. Avoid sweetened soft drinks, sweetened tea, lemonade, sports drinks, and juices that are not 100% juice. Aim for foods with less than 5 grams of added sugar. Every 4 grams of added sugar is 1 teaspoon (tsp) of sugar per serving. Cooking When cooking, use heart-healthy oils that are high in monounsaturated fats. These include olive oil, canola oil, and avocado oil. Limit frying or deep-frying foods. Cook foods using healthy methods such as baking, boiling, steaming, and grilling instead. Meal planning You may want to keep track of how many calories you eat and drink. Eating the right amount of calories will help you achieve a healthy weight. Meeting with a  dietitian can help you get started. Limit how often you eat takeout and fast food. These foods are usually very high in fat, salt, and sugar. Use the glycemic index (GI) to plan your meals. The index tells you how quickly a food will raise your blood sugar. Choose low-GI foods. Low-GI foods have a GI less than 55. These foods take a longer time to raise blood sugar. A dietitian can help you pick foods that are lower on the GI scale. Try to include some meals each week that replace meat with beans or legumes. Add fish 2-3 times a week, especially heart healthy oily fishes like salmon, sardines, trout, tuna, or mackerel. Lifestyle You may want to follow a Mediterranean diet. This diet includes a lot of vegetables, lean meats or fish, nuts and seeds, whole grains, fruits, and healthy oils and fats. What foods can I eat?  Fruits Apples. Bananas. Pears. Grapes. Papaya. Plums. Kiwi. Grapefruit. Cherries. Strawberries. Vegetables Lettuce. Spinach. Peas. Beets. Cauliflower. Cabbage. Broccoli. Carrots. Tomatoes. Squash. Eggplant. Herbs. Peppers. Onions. Cucumbers. Brussels sprouts. Yams and sweet potatoes. Grains Whole wheat or whole-grain foods, including breads, crackers, cereals, and pasta. Stone-ground whole wheat. Unsweetened oatmeal. Bulgur. Barley. Quinoa. Brown or wild rice. Corn or whole wheat flour tortillas. Meats and other proteins Lean meats. Poultry. Tofu. Seafood and shellfish. Beans. Lentils. Dairy Low-fat or fat-free dairy products, such as yogurt, cottage cheese, or cheese. Beverages Water. Sugar-free drinks. Tea. Coffee. Low-fat or skim milk. Milk alternatives, such as unsweetened soy, oat, or almond milk. Real fruit juice. Fats and oils Avocado. Canola or  olive oil. Nuts and nut butters. Seeds. Seasonings and condiments Mustard. Relish. Low-fat, low-sugar ketchup and barbecue sauce. Low-fat or fat-free mayonnaise. Sweets and desserts Sugar-free sweets. The items listed above may  not be all the foods and drinks you can have. Talk to a dietitian to learn more. What foods should I limit or avoid? Grains White rice. Pasta. Breads. Meats and other proteins Limit red meat to 1-2 times a week. Dairy [employer]. Fats and oils Palm oil and coconut oil. Fried foods. Other foods Processed foods. Foods that contain a lot of salt (sodium) or added sugar. Sweets and desserts Sweets that contain sugar. Bakery items such as cookies, cakes, and other pastries. Beverages Sweetened drinks, such as sweet tea, milkshakes, iced sweet drinks, and sodas. Alcohol. The items listed above may not be all the foods and drinks you should avoid. Talk to a dietitian to learn more. Where to find more information The [employer] of Diabetes and Digestive and Kidney Diseases: StageSync.si This information is not intended to replace advice given to you by your health care provider. Make sure you discuss any questions you have with your health care provider. Document Revised: 04/18/2022 Document Reviewed: 04/18/2022 Elsevier Patient Education  2024 Elsevier Inc. Mediterranean Diet A Mediterranean diet is based on the traditions of countries on the [employer]. It focuses on eating more: Fruits and vegetables. Whole grains, beans, nuts, and seeds. Heart-healthy fats. These are fats that are good for your heart. It involves eating less: Dairy. Meat and eggs. Processed foods with added sugar, salt, and fat. This type of diet can help prevent certain conditions. It can also improve outcomes if you have a long-term (chronic) disease, such as kidney or heart disease. What are tips for following this plan? Reading food labels Check packaged foods for: The serving size. For foods such as rice and pasta, the serving size is the amount of cooked product, not dry. The total fat. Avoid foods with saturated fat or trans fat. Added sugars, such as corn syrup. Shopping  Try to have a  balanced diet. Buy a variety of foods, such as: Fresh fruits and vegetables. You may be able to get these from local farmers markets. You can also buy them frozen. Grains, beans, nuts, and seeds. Some of these can be bought in bulk. Fresh seafood. Poultry and eggs. Low-fat dairy products. Buy whole ingredients instead of foods that have already been packaged. If you can't get fresh seafood, buy precooked frozen shrimp or canned fish, such as tuna, salmon, or sardines. Stock your pantry so you always have certain foods on hand, such as olive oil, canned tuna, canned tomatoes, rice, pasta, and beans. Cooking Cook foods with extra-virgin olive oil instead of using butter or other vegetable oils. Have meat as a side dish. Have vegetables or grains as your main dish. This means having meat in small portions or adding small amounts of meat to foods like pasta or stew. Use beans or vegetables instead of meat in common dishes like chili or lasagna. Try out different cooking methods. Try roasting, broiling, steaming, and sauting vegetables. Add frozen vegetables to soups, stews, pasta, or rice. Add nuts or seeds for added healthy fats and plant protein at each meal. You can add these to yogurt, salads, or vegetable dishes. Marinate fish or vegetables using olive oil, lemon juice, garlic, and fresh herbs. Meal planning Plan to eat a vegetarian meal one day each week. Try to work up to two vegetarian meals, if  possible. Eat seafood two or more times a week. Have healthy snacks on hand. These may include: Vegetable sticks with hummus. Greek yogurt. Fruit and nut trail mix. Eat balanced meals. These should include: Fruit: 2-3 servings a day. Vegetables: 4-5 servings a day. Low-fat dairy: 2 servings a day. Fish, poultry, or lean meat: 1 serving a day. Beans and legumes: 2 or more servings a week. Nuts and seeds: 1-2 servings a day. Whole grains: 6-8 servings a day. Extra-virgin olive oil: 3-4  servings a day. Limit red meat and sweets to just a few servings a month. Lifestyle  Try to cook and eat meals with your family. Drink enough fluid to keep your pee (urine) pale yellow. Be active every day. This includes: Aerobic exercise, which is exercise that causes your heart to beat faster. Examples include running and swimming. Leisure activities like gardening, walking, or housework. Get 7-8 hours of sleep each night. Drink red wine if your provider says you can. A glass of wine is 5 oz (150 mL). You may be allowed to have: Up to 1 glass a day if you're female and not pregnant. Up to 2 glasses a day if you're female. What foods should I eat? Fruits Apples. Apricots. Avocado. Berries. Bananas. Cherries. Dates. Figs. Grapes. Lemons. Melon. Oranges. Peaches. Plums. Pomegranate. Vegetables Artichokes. Beets. Broccoli. Cabbage. Carrots. Eggplant. Green beans. Chard. Kale. Spinach. Onions. Leeks. Peas. Squash. Tomatoes. Peppers. Radishes. Grains Whole-grain pasta. Brown rice. Bulgur wheat. Polenta. Couscous. Whole-wheat bread. Orpah Cobb. Meats and other proteins Beans. Almonds. Sunflower seeds. Pine nuts. Peanuts. Cod. Salmon. Scallops. Shrimp. Tuna. Tilapia. Clams. Oysters. Eggs. Chicken or [place] without skin. Dairy Low-fat milk. Cheese. Greek yogurt. Fats and oils Extra-virgin olive oil. Avocado oil. Grapeseed oil. Beverages Water. Red wine. Herbal tea. Sweets and desserts Greek yogurt with honey. Baked apples. Poached pears. Trail mix. Seasonings and condiments Basil. Cilantro. Coriander. Cumin. Mint. Parsley. Sage. Rosemary. Tarragon. Garlic. Oregano. Thyme. Pepper. Balsamic vinegar. Tahini. Hummus. Tomato sauce. Olives. Mushrooms. The items listed above may not be all the foods and drinks you can have. Talk to a dietitian to learn more. What foods should I limit? This is a list of foods that should be eaten rarely. Fruits Fruit canned in syrup. Vegetables Deep-fried  potatoes, like [place] fries. Grains Packaged pasta or rice dishes. Cereal with added sugar. Snacks with added sugar. Meats and other proteins Beef. Pork. Lamb. Chicken or [place] with skin. Hot dogs. Tomasa Blase. Dairy Ice cream. Sour cream. Whole milk. Fats and oils Butter. Canola oil. Vegetable oil. Beef fat (tallow). Lard. Beverages Juice. Sugar-sweetened soft drinks. Beer. Liquor and spirits. Sweets and desserts Cookies. Cakes. Pies. Candy. Seasonings and condiments Mayonnaise. Pre-made sauces and marinades. The items listed above may not be all the foods and drinks you should limit. Talk to a dietitian to learn more. Where to find more information American Heart Association (AHA): heart.org This information is not intended to replace advice given to you by your health care provider. Make sure you discuss any questions you have with your health care provider. Document Revised: 10/16/2022 Document Reviewed: 10/16/2022 Elsevier Patient Education  2024 [employer].

## 2023-02-14 NOTE — Progress Notes (Signed)
    Wyline Mood MD, MRCP(U.K) [address]  [address]  [place], [place] [address]  Main: [phone]  Fax: [phone]   Primary Care Physician: Eden Emms, NP  Primary Gastroenterologist:  Dr. Wyline Mood   Chief Complaint  Patient presents with   Elevated LFTs    HPI: Renee Daniels is a 57 y.o. female   Summary of history :  Here to see me as follow up for abnormal Lft's first seen in 10/2022: First noted abnormality in LFT's in > 2 years noted particularly after she had weight loss surgery i.e. bariatric surgery, Alcohol use glass of wine every other day never more No other risk factors.   06/27/2022: RUQ USG: Normal   Interval history   11/14/2022-02/14/2023   11/14/2022:  AST and ALT 81 . CK, LKM,  Immune to hep A, not immune to hep B. Negative for full viral and autoimmune labs.   Unable to calculate Fibrosis 4 Score. Requires ALT, AST, and platelet count within the last 6 months.  She is doing well with no complaints.  Drinks about 1 glass of wine 2-3 times a week Current Outpatient Medications  Medication Sig Dispense Refill   Biotin 1 MG CAPS Take by mouth.     Multiple Vitamins-Minerals (BARIATRIC MULTIVITAMINS/IRON PO) Take 1 capsule by mouth daily.     OVER THE COUNTER MEDICATION Bariatric Vitamin with Iron-1 daily     venlafaxine XR (EFFEXOR XR) 75 MG 24 hr capsule Take 1 capsule (75 mg total) by mouth daily with breakfast. 90 capsule 0   No current facility-administered medications for this visit.    Allergies as of 02/14/2023   (No Known Allergies)      ROS:  General: Negative for anorexia, weight loss, fever, chills, fatigue, weakness. ENT: Negative for hoarseness, difficulty swallowing , nasal congestion. CV: Negative for chest pain, angina, palpitations, dyspnea on exertion, peripheral edema.  Respiratory: Negative for dyspnea at rest, dyspnea on exertion, cough, sputum, wheezing.  GI: See history of present illness. GU:  Negative  for dysuria, hematuria, urinary incontinence, urinary frequency, nocturnal urination.  Endo: Negative for unusual weight change.    Physical Examination:   BP (!) 151/87   Pulse (!) 54   Temp 98 F (36.7 C) (Oral)   Wt 184 lb (83.5 kg)   LMP 09/08/2020   BMI 32.59 kg/m   General: Well-nourished, well-developed in no acute distress.  Eyes: No icterus. Conjunctivae pink. Neuro: Alert and oriented x 3.  Grossly intact. Skin: Warm and dry, no jaundice.   Psych: Alert and cooperative, normal mood and affect.   Imaging Studies: No results found.  Assessment and Plan:   Renee Daniels is a 57 y.o. y/o female here to follow-up for abnormal LFTs full autoimmune and viral hepatitis workup was negative.  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  Hep B vaccine Lose weight , exercise and eat healthy such as Mediterranean diet limit alcohol intake  Dr Wyline Mood  MD,MRCP [facility]) Follow up in 3 to 4 months

## 2023-02-15 ENCOUNTER — Telehealth: Payer: Self-pay

## 2023-02-15 DIAGNOSIS — K760 Fatty (change of) liver, not elsewhere classified: Secondary | ICD-10-CM

## 2023-02-15 DIAGNOSIS — R7989 Other specified abnormal findings of blood chemistry: Secondary | ICD-10-CM

## 2023-02-15 NOTE — Telephone Encounter (Signed)
Called patient but had to leave her a detailed message about her LFT's being better but based on her results, she will need an ultrasound elastography scan. I told her to call the scheduling department to schedule and if she had additional questions, to please call me.

## 2023-02-15 NOTE — Telephone Encounter (Signed)
-----   Message from Wyline Mood sent at 02/15/2023  8:14 AM EDT ----- Inform LFT's better but based on results needs ultrasound elastrography scan before next visit

## 2023-02-15 NOTE — Progress Notes (Signed)
Inform LFT's better but based on results needs ultrasound elastrography scan before next visit

## 2023-03-06 ENCOUNTER — Ambulatory Visit
Admission: RE | Admit: 2023-03-06 | Discharge: 2023-03-06 | Disposition: A | Discharge status: Home / Self Care | Payer: Managed Care, Other (non HMO) | Source: Ambulatory Visit | Attending: Gastroenterology | Admitting: Gastroenterology

## 2023-03-06 DIAGNOSIS — R7989 Other specified abnormal findings of blood chemistry: Secondary | ICD-10-CM | POA: Diagnosis present

## 2023-03-06 DIAGNOSIS — K760 Fatty (change of) liver, not elsewhere classified: Secondary | ICD-10-CM | POA: Insufficient documentation

## 2023-06-19 ENCOUNTER — Ambulatory Visit: Payer: Managed Care, Other (non HMO) | Admitting: Gastroenterology

## 2023-08-30 ENCOUNTER — Other Ambulatory Visit: Payer: Self-pay | Admitting: Nurse Practitioner

## 2023-08-30 DIAGNOSIS — Z1231 Encounter for screening mammogram for malignant neoplasm of breast: Secondary | ICD-10-CM

## 2023-09-11 ENCOUNTER — Ambulatory Visit
Admission: RE | Admit: 2023-09-11 | Discharge: 2023-09-11 | Disposition: A | Discharge status: Home / Self Care | Payer: Managed Care, Other (non HMO) | Source: Ambulatory Visit | Attending: Nurse Practitioner | Admitting: Nurse Practitioner

## 2023-09-11 DIAGNOSIS — Z1231 Encounter for screening mammogram for malignant neoplasm of breast: Secondary | ICD-10-CM

## 2023-09-15 ENCOUNTER — Encounter: Payer: Self-pay | Admitting: Nurse Practitioner

## 2024-05-14 ENCOUNTER — Encounter (HOSPITAL_COMMUNITY): Payer: Self-pay | Admitting: *Deleted
